# Patient Record
Sex: Male | Born: 1940
Health system: Southern US, Community
[De-identification: ages and names within clinical notes are randomized; demographics above are authoritative.]

## PROBLEM LIST (undated history)

## (undated) DIAGNOSIS — C801 Malignant (primary) neoplasm, unspecified: Secondary | ICD-10-CM

## (undated) DIAGNOSIS — I10 Essential (primary) hypertension: Secondary | ICD-10-CM

## (undated) DIAGNOSIS — R3912 Poor urinary stream: Secondary | ICD-10-CM

## (undated) DIAGNOSIS — N400 Enlarged prostate without lower urinary tract symptoms: Secondary | ICD-10-CM

## (undated) DIAGNOSIS — R972 Elevated prostate specific antigen [PSA]: Secondary | ICD-10-CM

## (undated) DIAGNOSIS — N529 Male erectile dysfunction, unspecified: Secondary | ICD-10-CM

## (undated) HISTORY — PX: PROSTATE BIOPSY: SHX241

---

## 2000-03-16 ENCOUNTER — Encounter: Payer: Self-pay | Admitting: Internal Medicine

## 2000-03-16 ENCOUNTER — Encounter: Admission: RE | Admit: 2000-03-16 | Discharge: 2000-03-16 | Payer: Self-pay | Admitting: Internal Medicine

## 2000-03-23 ENCOUNTER — Encounter: Admission: RE | Admit: 2000-03-23 | Discharge: 2000-03-23 | Payer: Self-pay | Admitting: Internal Medicine

## 2000-03-23 ENCOUNTER — Encounter: Payer: Self-pay | Admitting: Internal Medicine

## 2011-07-13 ENCOUNTER — Encounter (HOSPITAL_COMMUNITY): Payer: Self-pay

## 2011-07-13 ENCOUNTER — Emergency Department (HOSPITAL_COMMUNITY)
Admission: EM | Admit: 2011-07-13 | Discharge: 2011-07-13 | Disposition: A | Discharge status: Home / Self Care | Payer: Medicare HMO | Attending: Emergency Medicine | Admitting: Emergency Medicine

## 2011-07-13 DIAGNOSIS — I10 Essential (primary) hypertension: Secondary | ICD-10-CM | POA: Insufficient documentation

## 2011-07-13 DIAGNOSIS — S335XXA Sprain of ligaments of lumbar spine, initial encounter: Secondary | ICD-10-CM | POA: Insufficient documentation

## 2011-07-13 DIAGNOSIS — Y9302 Activity, running: Secondary | ICD-10-CM | POA: Insufficient documentation

## 2011-07-13 DIAGNOSIS — S39012A Strain of muscle, fascia and tendon of lower back, initial encounter: Secondary | ICD-10-CM

## 2011-07-13 DIAGNOSIS — X58XXXA Exposure to other specified factors, initial encounter: Secondary | ICD-10-CM | POA: Insufficient documentation

## 2011-07-13 HISTORY — DX: Essential (primary) hypertension: I10

## 2011-07-13 LAB — URINALYSIS, ROUTINE W REFLEX MICROSCOPIC
Bilirubin Urine: NEGATIVE
Glucose, UA: NEGATIVE mg/dL
Hgb urine dipstick: NEGATIVE
Ketones, ur: NEGATIVE mg/dL
Nitrite: NEGATIVE
Protein, ur: NEGATIVE mg/dL
Specific Gravity, Urine: 1.016 (ref 1.005–1.030)
Urobilinogen, UA: 0.2 mg/dL (ref 0.0–1.0)
pH: 7.5 (ref 5.0–8.0)

## 2011-07-13 LAB — URINE MICROSCOPIC-ADD ON

## 2011-07-13 MED ORDER — NAPROXEN 500 MG PO TABS: 500.0000 mg | ORAL_TABLET | Freq: Two times a day (BID) | ORAL | Status: AC

## 2011-07-13 MED ORDER — ORPHENADRINE CITRATE ER 100 MG PO TB12: 100.0000 mg | ORAL_TABLET | Freq: Two times a day (BID) | ORAL | Status: AC

## 2011-07-13 MED ORDER — HYDROCODONE-ACETAMINOPHEN 5-325 MG PO TABS: 1.0000 | ORAL_TABLET | Freq: Four times a day (QID) | ORAL | Status: AC | PRN

## 2011-07-13 NOTE — ED Provider Notes (Signed)
Medical screening examination/treatment/procedure(s) were conducted as a shared visit with non-physician practitioner(s) and myself.  I personally evaluated the patient during the encounter  Patient with hamstring injury last week.  He now has some lower left back pain.  This is likely musculoskeletal in nature as it can worsen with changes in position.  He has no associated signs of infection.  No urinary symptoms.  No bowel symptoms.  No abdominal pain.  No neurovascular deficits at this time.  Patient will go home and followup with his primary care physician if not improving.  Nat Christen, MD 07/13/11 [phone]

## 2011-07-13 NOTE — Discharge Instructions (Signed)
Read the information below.  Do not take additional tylenol while taking the prescribed medication Norco (hydrocodone-acetaminophen) to avoid tylenol overdose.  If you develop uncontrolled pain, fevers, loss of control of bowel or bladder, weakness or numbness of your legs, return to the ER immediately.  You may return to the ER at any time for worsening condition or any new symptoms that concern you.

## 2011-07-13 NOTE — ED Provider Notes (Signed)
History     CSN: [id number]  Arrival date & time 07/13/11  0750   First MD Initiated Contact with Patient 07/13/11 0801      Chief Complaint  Patient presents with  . Back Pain    (Consider location/radiation/quality/duration/timing/severity/associated sxs/prior treatment) HPI Comments: Patient is a healthy runner, recently suffered a hamstring injury and has been resting and not running until 4 days ago when he began running again and ran a long distance.  That night, he began having pain in his left lower back.  Pain is described as a soreness, is worse with moving, bending, or attempting to stand from a seated position, is improved with laying in a recliner with a heating pad.  If pain radiates, he states it radiates laterally across low back.  Denies fevers, abdominal pain, urinary symptoms, loss of control of bowel or bladder.    Patient is a 71 y.o. male presenting with back pain. The history is provided by the patient.  Back Pain  Pertinent negatives include no chest pain, no fever, no numbness, no dysuria and no weakness.    Past Medical History  Diagnosis Date  . Hypertension     History reviewed. No pertinent past surgical history.  No family history on file.  History  Substance Use Topics  . Smoking status: Not on file  . Smokeless tobacco: Not on file  . Alcohol Use: No      Review of Systems  Constitutional: Negative for fever and chills.  Respiratory: Negative for shortness of breath.   Cardiovascular: Negative for chest pain.  Gastrointestinal: Negative for nausea and vomiting.  Genitourinary: Negative for dysuria, urgency and frequency.  Musculoskeletal: Positive for back pain.  Neurological: Negative for weakness and numbness.    Allergies  Review of patient's allergies indicates no known allergies.  Home Medications  No current outpatient prescriptions on file.  BP 147/84  Pulse 79  Temp(Src) 98.5 F (36.9 C) (Oral)  Resp 20  SpO2  98%  Physical Exam  Nursing note and vitals reviewed. Constitutional: He is oriented to person, place, and time. He appears well-developed and well-nourished.  HENT:  Head: Normocephalic and atraumatic.  Neck: Normal range of motion. Neck supple.  Cardiovascular: Normal rate and regular rhythm.   Pulmonary/Chest: Effort normal and breath sounds normal. No respiratory distress. He has no wheezes. He has no rales.  Abdominal: Soft. He exhibits no distension and no mass. There is no tenderness. There is no rebound and no guarding.  Musculoskeletal: Normal range of motion. He exhibits no edema and no tenderness.       Cervical back: Normal.       Thoracic back: Normal.       Lumbar back: Normal.       Lower extremities: strength 5/5, sensation intact, distal pulses intact.  Negative straight leg raise.    Neurological: He is alert and oriented to person, place, and time. He has normal strength. No sensory deficit.    ED Course  Procedures (including critical care time)  Labs Reviewed  URINALYSIS, ROUTINE W REFLEX MICROSCOPIC - Abnormal; Notable for the following:    Leukocytes, UA TRACE (*)    All other components within normal limits  URINE MICROSCOPIC-ADD ON   No results found.  8:31 AM Discussed patient with Dr Golda Acre.    Patient also seen by Dr Golda Acre.    1. Lumbosacral strain, initial encounter       MDM  Healthy athletic patient with recent injury to  left hamstring (2 weeks ago), began running again a few days ago and developed soreness in his left lower back, worse with movement, etc.  Appears very clearly to be muscular. No bony or midline tenderness.  No neurological symptoms.  Pt d/c home with NSAIDs, norflex (changed to robaxin - contact by flow manager - pharmacist did not have norflex in stock), and norco.  Return precautions given.  PCP follow up.  Patient verbalizes understanding and agrees with plan.            Rise Patience, [place] 07/13/11 1600

## 2011-07-13 NOTE — ED Notes (Signed)
Pt c/o lower back pain radiating to lt buttocks, denies injury

## 2011-07-13 NOTE — ED Notes (Signed)
[place] @ [facility] pharmacy called requesting change in Norflex rx, pharmacy does not carry med anymore. T.O. Robaxin 750 mg tabs, two tabs PO up to four times a day prn muscles spasms/pain, dispense forty given by Trixie Dredge PA to [facility] RN, read back and verified. Robaxin RX called to [place] @ [facility] [phone].

## 2011-07-15 NOTE — ED Provider Notes (Signed)
Medical screening examination/treatment/procedure(s) were performed by non-physician practitioner and as supervising physician I was immediately available for consultation/collaboration.   Dimas Scheck A Hilari Wethington, MD 07/15/11 0006 

## 2011-07-19 ENCOUNTER — Encounter: Payer: Self-pay | Admitting: Family Medicine

## 2011-07-19 ENCOUNTER — Ambulatory Visit (INDEPENDENT_AMBULATORY_CARE_PROVIDER_SITE_OTHER): Payer: Medicare HMO | Admitting: Family Medicine

## 2011-07-19 VITALS — BP 122/79 | HR 89 | Ht 70.0 in | Wt 175.0 lb

## 2011-07-19 DIAGNOSIS — M545 Low back pain, unspecified: Secondary | ICD-10-CM

## 2011-07-19 NOTE — Assessment & Plan Note (Addendum)
May have partial tear (?erector spinae vs transversus abdominus) while pulling rope to try to get leaf blower started. Pain is improving. Patient given hip and core strengthening exercises and a return-to-running regimen. May continue NSAIDs prn.

## 2011-07-19 NOTE — Patient Instructions (Addendum)
Try the elliptical/bike for the next 1-2 weeks until running does not hurt. Then go on some slow runs.   Try the exercises to help rehabilitate your hip.  Incorporate core strengthening exercises to your work-out regimen.   Return to clinic as needed.   2. hip abductor rotations. standing, hip flexion and rotation outward then inward. 3 sets, 15 reps. when can do comfortably, add ankle weights starting at 2 pounds.   4. SINK STRETCH - YOU CAN DO THIS WHENEVER YOU WANT DURING THE DAY  Core Workouts given in the office   Recheck in 4 weeks

## 2011-07-19 NOTE — Progress Notes (Signed)
  Subjective:    Patient ID: Harry Garcia, male    DOB: Mar 05, 1940, 71 y.o.   MRN: [id number]  HPI 71 year old healthy, active male who is a serious runner (30-40 miles a week) who has had pain in his left hip/back for about 2 weeks. Inciting event was probably pulling a rope to get leaf blower started. He started having pain the next day.  Last Wednesday, he went to the ED because of a muscle spasm and was given an NSAID and muscle relaxants to take. He has not been running since the pain started.  Pain today is much better than Wednesday, and he has not been needing medications every day.   Review of Systems Per HPI.  Past Medical History, Family History, and Social History reviewed.     Objective:   Physical Exam GEN: NAD; Caucasian; well-appearing, well-nourished; appears fit PSYCH: engaged, appropriate to questions, alert and oriented x 3 PULM: NI WOB  GEN: WDWN, NAD, Non-toxic, Alert & Oriented x 3 HEENT: Atraumatic, Normocephalic.  Ears and Nose: No external deformity. EXTR: No clubbing/cyanosis/edema NEURO: Normal gait.  PSYCH: Normally interactive. Conversant. Not depressed or anxious appearing.  Calm demeanor.   TTP L lower erector spinae and lateral  HIP EXAM: SIDE: LEFT ROM: Abduction, Flexion, Internal and External range of motion: intact Pain with terminal IROM and EROM: none GTB: SLR: NEG Knees: No effusion FABER: NT REVERSE FABER: NT, neg Piriformis: ?minimal tenderness  Str: flexion: 5/5 abduction: 5/5 adduction: 5/5    Assessment & Plan:

## 2011-09-19 ENCOUNTER — Encounter: Payer: Self-pay | Admitting: Sports Medicine

## 2011-09-19 ENCOUNTER — Ambulatory Visit (INDEPENDENT_AMBULATORY_CARE_PROVIDER_SITE_OTHER): Payer: Medicare Other | Admitting: Sports Medicine

## 2011-09-19 VITALS — BP 124/70

## 2011-09-19 DIAGNOSIS — S76309A Unspecified injury of muscle, fascia and tendon of the posterior muscle group at thigh level, unspecified thigh, initial encounter: Secondary | ICD-10-CM

## 2011-09-19 DIAGNOSIS — S79919A Unspecified injury of unspecified hip, initial encounter: Secondary | ICD-10-CM

## 2011-09-19 NOTE — Progress Notes (Signed)
  Subjective:    Patient ID: Harry Garcia, male    DOB: 1940/03/31, 71 y.o.   MRN: [id number]  HPI chief complaint: Left hamstring pain  Patient comes in today with persistent pain in the left hamstring. He most recently saw Dr. Dallas Schimke who placed him on a comprehensive core strengthening program. Dr. Dallas Schimke felt like some of his symptoms may be originating from his low-back. Patient's symptoms were improving until he went for a 5 mile run. Since then he's had a return of pain. Most of his pain is in the back of the left knee but some pain more proximally in the hamstring muscle belly. He describes his pain as a "tightness ". He also gets intermittent left-sided low back pain. All of his symptoms are worse after activity. No real pain while running. Mild pain with sitting. No associated numbness or tingling. No deep-seated groin. His symptoms all began during a race this past spring. At that time he felt an acute pull in the back of his left knee.    Review of Systems     Objective:   Physical Exam Well-developed, well-nourished. No acute distress Lumbar spine shows full painless lumbar mobility. No tenderness to palpation along the lumbar midline or paraspinal musculature. No spasm. Left hip shows smooth painless hip range of motion with a negative log roll Neurological exam shows strength to be 5/5 both lower extremities. Reflexes 1/4 at the patellar and Achilles tendons bilaterally. No atrophy. Sensation is intact to light-touch distally. Examination of the left knee shows full motion without an effusion. He is tender to palpation along the biceps femoris tendon just proximal to the popliteal fossa. No palpable defect. Some tenderness to palpation just medial to the tendon as well. Good strength. No ecchymosis. He walks without a limp.  MSK ultrasound of the left knee: Quick scan of the popliteal shows some hypoechogenicity along the course of the biceps femoris tendon consistent with  probable tendinitis. This is in comparison to the right biceps femoris tendon which appears normal. I do not however appreciate any tears.       Assessment & Plan:  1. Distal hamstring tendinitis   Although there is a possibility that his symptoms are referred from his lumbar spine, his physical exam and ultrasound suggest tendinitis of the distal hamstring tendon. We will treat him with some eccentric strengthening and a body helix sleeve. He can continue with activity including running as tolerated. Followup with me in 4 weeks. If symptoms persist or worsen, I may consider starting with a lumbar spine x-ray. He will call with questions or concerns in the interim.

## 2011-10-06 ENCOUNTER — Encounter: Payer: Self-pay | Admitting: Sports Medicine

## 2011-10-06 ENCOUNTER — Ambulatory Visit (INDEPENDENT_AMBULATORY_CARE_PROVIDER_SITE_OTHER): Payer: Medicare Other | Admitting: Sports Medicine

## 2011-10-06 VITALS — BP 136/80 | HR 73 | Ht 70.0 in | Wt 175.0 lb

## 2011-10-06 DIAGNOSIS — IMO0002 Reserved for concepts with insufficient information to code with codable children: Secondary | ICD-10-CM

## 2011-10-06 DIAGNOSIS — S76319A Strain of muscle, fascia and tendon of the posterior muscle group at thigh level, unspecified thigh, initial encounter: Secondary | ICD-10-CM

## 2011-10-06 NOTE — Progress Notes (Signed)
  Subjective:    Patient ID: Harry Garcia, male    DOB: 1940-08-11, 71 y.o.   MRN: [id number]  HPI patient comes in today for followup on his left hamstring injury. Feeling much better. Body heel is compression sleeve has been helpful. He centric exercises were somewhat painful so he has stopped those but despite that his leg pain has improved. He still having a little bit of left-sided low back pain but not his pronounced as on his previous visit. He states he is somewhat stiff with first getting up in the morning but this resolved with activity. He has been able to continue running and in fact states that running is fairly pain-free.    Review of Systems     Objective:   Physical Exam Good lumbar range of motion. No tenderness to palpation over the lumbar midline. No appreciable muscle spasm. Left knee shows full motion without effusion. No tenderness over the distal biceps tendons. Good joint stability Neurological exam shows a negative straight leg raise bilaterally with strength 5/5 both lower charities. No muscle atrophy. He walks without a limp.       Assessment & Plan:  1. Improved left knee pain secondary to hamstring strain   Patient is feeling much better. Only real complaint is a persistent low back stiffness first thing in the morning but not severe enough that he feels like he needs to be worked up further. He can continue with activity as tolerated. I'm still not 100% convinced that he doesn't have some element of discomfort in the left knee from his low-back but with his improvement I think we can hold on further workup and treatment. He will let me know if symptoms persist or worsen.

## 2011-11-14 ENCOUNTER — Telehealth: Payer: Self-pay | Admitting: Sports Medicine

## 2011-11-14 NOTE — Telephone Encounter (Signed)
I spoke with the patient today on the phone. He's having some returning discomfort along the left side of his lumbar spine and hip. His symptoms were exacerbated after a long car ride recently. He is having some stiffness with first awakening along with pain with activity such as running. We discussed the possibility of his lumbar spine being the culprit for his symptoms during his last visit. It is quite possible that he has some facet arthropathy which is causing him some discomfort. We discussed the possibility of x-rays to investigate this further, but the patient would instead like to try to modify his workouts for the next 3-4 weeks to see if symptoms will resolve on their own. He noted that an occasional ibuprofen or Advil does alleviate his pain. If his symptoms persist or worsen he will return to the office for reexamination and consideration of x-rays of his lumbar spine.

## 2011-11-14 NOTE — Telephone Encounter (Signed)
Message copied by Ralene Cork on Mon Nov 14, 2011  3:42 PM ------      Message from: CERESI, Shawna Orleans L      Created: Mon Nov 14, 2011  9:26 AM      Regarding: phone message      Contact: [phone]       Pt would like to talk to you regarding his running injury, still having pain.  Please call him at the number above.

## 2011-11-30 ENCOUNTER — Ambulatory Visit (INDEPENDENT_AMBULATORY_CARE_PROVIDER_SITE_OTHER): Payer: Medicare HMO | Admitting: Sports Medicine

## 2011-11-30 ENCOUNTER — Ambulatory Visit
Admission: RE | Admit: 2011-11-30 | Discharge: 2011-11-30 | Disposition: A | Discharge status: Home / Self Care | Payer: Medicare HMO | Source: Ambulatory Visit | Attending: Sports Medicine | Admitting: Sports Medicine

## 2011-11-30 ENCOUNTER — Telehealth: Payer: Self-pay | Admitting: Sports Medicine

## 2011-11-30 VITALS — BP 134/82 | Ht 70.0 in | Wt 180.0 lb

## 2011-11-30 DIAGNOSIS — M545 Low back pain, unspecified: Secondary | ICD-10-CM

## 2011-11-30 DIAGNOSIS — M5137 Other intervertebral disc degeneration, lumbosacral region: Secondary | ICD-10-CM

## 2011-11-30 DIAGNOSIS — M5136 Other intervertebral disc degeneration, lumbar region: Secondary | ICD-10-CM

## 2011-11-30 NOTE — Telephone Encounter (Signed)
I spoke with the patient today on the phone regarding x-rays of his lumbar spine. X-rays confirm will we suspected clinically and that is that he has degenerative disc disease. It is most pronounced at L5-S1. He will proceed with treatment as outlined in the progress note earlier today and will followup with me when necessary.

## 2011-11-30 NOTE — Progress Notes (Signed)
  Subjective:    Patient ID: Harry Garcia, male    DOB: December 17, 1940, 71 y.o.   MRN: [id number]  HPI  Patient comes in today with persistent low back pain. The pain he was having in his left hamstring previously has resolved. His main complaint is stiffness in his low back first thing in the morning as well as with first getting up from a seated position. Stiffness can be present for 30 minutes to 2 hours. Minimal pain. Stiffness improves with activity. He enjoys running and states that he is able to arrive in practically pain free. He believes that his stiffness is likely due to arthritis of his back in the main reason for today's visit is for me to either confirm or deny this.    Review of Systems     Objective:   Physical Exam Well-developed, well-nourished. No acute distress. Awake alert and oriented x3  Lumbar spine shows full painless lumbar range of motion. No tenderness along the lumbar midline. Slight tenderness just to the left of the L5 vertebrae but no paraspinal muscle spasm. No tenderness over the SI joints. Negative straight leg raise bilaterally, negative log roll bilaterally. Strength is 5/5 both lower extremities reflexes are 1/4 at the Achilles and patellar tendons bilaterally. No muscle atrophy. Sensation is intact to light-touch distally. He walks without a limp.       Assessment & Plan:  1. Low back pain and stiffness secondary to lumbar degenerative disc disease  Patient symptoms are pretty classic for lumbar degenerative disc disease. I will get an x-ray to confirm this diagnosis. I'll have him do a little formal physical therapy but I think you'll quickly wean to a home exercise program. I reassured him that he can continue to be as active as his symptoms allow. I'll call him after I reviewed his x-rays I will plan on seeing him back in the office on an as-needed basis.

## 2012-10-10 ENCOUNTER — Encounter: Payer: Self-pay | Admitting: Sports Medicine

## 2012-10-10 ENCOUNTER — Ambulatory Visit (INDEPENDENT_AMBULATORY_CARE_PROVIDER_SITE_OTHER): Payer: Medicare HMO | Admitting: Sports Medicine

## 2012-10-10 VITALS — BP 137/84 | HR 73 | Ht 70.0 in | Wt 178.0 lb

## 2012-10-10 DIAGNOSIS — M7662 Achilles tendinitis, left leg: Secondary | ICD-10-CM

## 2012-10-10 DIAGNOSIS — M7660 Achilles tendinitis, unspecified leg: Secondary | ICD-10-CM

## 2012-10-10 MED ORDER — NITROGLYCERIN 0.2 MG/HR TD PT24: MEDICATED_PATCH | TRANSDERMAL | Status: DC

## 2012-10-10 NOTE — Patient Instructions (Addendum)
Thank you for coming in today Okay to continue running as long as you have less than 3/10 pain and no limp. Consider 1 week of crosstraining before running Do calf exercises every day Try insoles with heel lift. Use nitroglycerin patch as below  Nitroglycerin Protocol   Apply 1/4 nitroglycerin patch to affected area daily.  Change position of patch within the affected area every 24 hours.  You may experience a headache during the first 1-2 weeks of using the patch, these should subside.  If you experience headaches after beginning nitroglycerin patch treatment, you may take your preferred over the counter pain reliever.  Another side effect of the nitroglycerin patch is skin irritation or rash related to patch adhesive.  Please notify our office if you develop more severe headaches or rash, and stop the patch.  Tendon healing with nitroglycerin patch may require 12 to 24 weeks depending on the extent of injury.  Men should not use if taking Viagra, Cialis, or Levitra.   Do not use if you have migraines or rosacea.

## 2012-10-10 NOTE — Progress Notes (Signed)
CC: Left Achilles tendinitis HPI: Patient is a very pleasant 72 year old male runner appearing younger than stated age who presents today for evaluation of left Achilles tendinitis. Patient states that he has continued his normal running routine of 60 minutes per day on a treadmill at approximately a 7 minute mile pace. He has not recently changed his mileage or pace. He states that about 3 weeks ago he was about 40 minutes into his run and started to notice pain in his left Achilles. He was able to continue running but later that day after he was finished he had worsening pain. He noted that it hurt him a lot to go down stairs. He took 7-10 days off of running and does a lot of crosstraining. However, he started running again last week and has noticed increased pain again. He is concerned because he wants to ensure that he does not have a more serious problem like a ruptured Achilles or stress fracture. He notes that his Achilles is tender to palpation. No swelling. He has not tried any medications, ice, heat, stretching.  ROS: As above in the HPI. All other systems are stable or negative.  OBJECTIVE: APPEARANCE:  Patient in no acute distress.The patient appeared well nourished and normally developed. HEENT: No scleral icterus. Conjunctiva non-injected Resp: Non labored Skin: No rash MSK:  Ankle: - No visible erythema or swelling. - Range of motion is full in all directions. - Tender over the Achilles tendon midsubstance with palpable thickening-  - Strength is 5/5 in all directions. - Stable lateral and medial ligaments; squeeze test and kleiger test unremarkable; - Talar dome nontender;  - Pain at base of 5th MT; No tenderness over cuboid; - No tenderness over N spot or navicular prominence - No tenderness on posterior aspects of lateral and medial malleolus - No sign of peroneal tendon subluxations; - Able to walk 4 steps.   MSK [place]: Musculoskeletal ultrasound of the left Achilles  tendon was performed in transverse and longitudinal views. There is noted to be thickening of the tendon in the midsubstance to a maximum of 0.9 cm. There is a hypoechoic layer superficial to the tendon consistent with edema. There is no increased Doppler signal to suggest neovascularization. No sign of fiber disruption or tear.   ASSESSMENT: #1. Left Achilles tendinitis, early stage   PLAN: Discussed with the patient that we think that he is relatively early in this problem, substrata correctly we may be able to get him better fairly quickly. Recommended that he consider crosstraining for one additional week with gradual return to running. He may run as long as he is not limping and has pain less than a 3/10. Recommended that he consider decreasing his mileage and paced. He was given a home exercise plan with decentered heel raises. We also gave him sport insoles with a heel lift to use in his exercise shoes. Finally, he will try the nitroglycerin protocol. We will see him back in 3-4 weeks for reevaluation and repeat ultrasound.

## 2012-11-21 ENCOUNTER — Ambulatory Visit: Payer: Medicare HMO | Admitting: Sports Medicine

## 2012-12-27 ENCOUNTER — Other Ambulatory Visit: Payer: Self-pay

## 2014-08-18 ENCOUNTER — Other Ambulatory Visit: Payer: Self-pay

## 2015-03-25 DIAGNOSIS — R69 Illness, unspecified: Secondary | ICD-10-CM | POA: Diagnosis not present

## 2015-08-05 DIAGNOSIS — H524 Presbyopia: Secondary | ICD-10-CM | POA: Diagnosis not present

## 2015-11-23 DIAGNOSIS — Z1389 Encounter for screening for other disorder: Secondary | ICD-10-CM | POA: Diagnosis not present

## 2015-11-23 DIAGNOSIS — N528 Other male erectile dysfunction: Secondary | ICD-10-CM | POA: Diagnosis not present

## 2015-11-23 DIAGNOSIS — E7800 Pure hypercholesterolemia, unspecified: Secondary | ICD-10-CM | POA: Diagnosis not present

## 2015-11-23 DIAGNOSIS — I10 Essential (primary) hypertension: Secondary | ICD-10-CM | POA: Diagnosis not present

## 2015-11-23 DIAGNOSIS — Z79899 Other long term (current) drug therapy: Secondary | ICD-10-CM | POA: Diagnosis not present

## 2015-11-23 DIAGNOSIS — Z23 Encounter for immunization: Secondary | ICD-10-CM | POA: Diagnosis not present

## 2015-11-23 DIAGNOSIS — Z0001 Encounter for general adult medical examination with abnormal findings: Secondary | ICD-10-CM | POA: Diagnosis not present

## 2015-11-23 DIAGNOSIS — J309 Allergic rhinitis, unspecified: Secondary | ICD-10-CM | POA: Diagnosis not present

## 2015-11-23 DIAGNOSIS — E559 Vitamin D deficiency, unspecified: Secondary | ICD-10-CM | POA: Diagnosis not present

## 2015-11-23 DIAGNOSIS — R972 Elevated prostate specific antigen [PSA]: Secondary | ICD-10-CM | POA: Diagnosis not present

## 2016-03-23 DIAGNOSIS — R69 Illness, unspecified: Secondary | ICD-10-CM | POA: Diagnosis not present

## 2016-12-12 DIAGNOSIS — R69 Illness, unspecified: Secondary | ICD-10-CM | POA: Diagnosis not present

## 2017-01-25 DIAGNOSIS — J309 Allergic rhinitis, unspecified: Secondary | ICD-10-CM | POA: Diagnosis not present

## 2017-01-25 DIAGNOSIS — I10 Essential (primary) hypertension: Secondary | ICD-10-CM | POA: Diagnosis not present

## 2017-01-25 DIAGNOSIS — E7800 Pure hypercholesterolemia, unspecified: Secondary | ICD-10-CM | POA: Diagnosis not present

## 2017-01-25 DIAGNOSIS — Z79899 Other long term (current) drug therapy: Secondary | ICD-10-CM | POA: Diagnosis not present

## 2017-01-25 DIAGNOSIS — E559 Vitamin D deficiency, unspecified: Secondary | ICD-10-CM | POA: Diagnosis not present

## 2017-01-25 DIAGNOSIS — R972 Elevated prostate specific antigen [PSA]: Secondary | ICD-10-CM | POA: Diagnosis not present

## 2017-01-25 DIAGNOSIS — Z0000 Encounter for general adult medical examination without abnormal findings: Secondary | ICD-10-CM | POA: Diagnosis not present

## 2017-01-25 DIAGNOSIS — N528 Other male erectile dysfunction: Secondary | ICD-10-CM | POA: Diagnosis not present

## 2017-01-25 DIAGNOSIS — Z1389 Encounter for screening for other disorder: Secondary | ICD-10-CM | POA: Diagnosis not present

## 2017-02-22 DIAGNOSIS — R69 Illness, unspecified: Secondary | ICD-10-CM | POA: Diagnosis not present

## 2017-06-07 DIAGNOSIS — H524 Presbyopia: Secondary | ICD-10-CM | POA: Diagnosis not present

## 2017-11-06 DIAGNOSIS — R69 Illness, unspecified: Secondary | ICD-10-CM | POA: Diagnosis not present

## 2018-03-13 DIAGNOSIS — R69 Illness, unspecified: Secondary | ICD-10-CM | POA: Diagnosis not present

## 2018-04-25 DIAGNOSIS — J309 Allergic rhinitis, unspecified: Secondary | ICD-10-CM | POA: Diagnosis not present

## 2018-04-25 DIAGNOSIS — E7800 Pure hypercholesterolemia, unspecified: Secondary | ICD-10-CM | POA: Diagnosis not present

## 2018-04-25 DIAGNOSIS — Z1389 Encounter for screening for other disorder: Secondary | ICD-10-CM | POA: Diagnosis not present

## 2018-04-25 DIAGNOSIS — R972 Elevated prostate specific antigen [PSA]: Secondary | ICD-10-CM | POA: Diagnosis not present

## 2018-04-25 DIAGNOSIS — Z79899 Other long term (current) drug therapy: Secondary | ICD-10-CM | POA: Diagnosis not present

## 2018-04-25 DIAGNOSIS — E559 Vitamin D deficiency, unspecified: Secondary | ICD-10-CM | POA: Diagnosis not present

## 2018-04-25 DIAGNOSIS — I10 Essential (primary) hypertension: Secondary | ICD-10-CM | POA: Diagnosis not present

## 2018-04-25 DIAGNOSIS — Z0001 Encounter for general adult medical examination with abnormal findings: Secondary | ICD-10-CM | POA: Diagnosis not present

## 2018-04-25 DIAGNOSIS — N528 Other male erectile dysfunction: Secondary | ICD-10-CM | POA: Diagnosis not present

## 2018-04-27 DIAGNOSIS — Z8379 Family history of other diseases of the digestive system: Secondary | ICD-10-CM | POA: Diagnosis not present

## 2018-04-27 DIAGNOSIS — R972 Elevated prostate specific antigen [PSA]: Secondary | ICD-10-CM | POA: Diagnosis not present

## 2018-06-25 DIAGNOSIS — R972 Elevated prostate specific antigen [PSA]: Secondary | ICD-10-CM | POA: Diagnosis not present

## 2018-10-01 DIAGNOSIS — N402 Nodular prostate without lower urinary tract symptoms: Secondary | ICD-10-CM | POA: Diagnosis not present

## 2018-10-01 DIAGNOSIS — R972 Elevated prostate specific antigen [PSA]: Secondary | ICD-10-CM | POA: Diagnosis not present

## 2018-10-22 DIAGNOSIS — R972 Elevated prostate specific antigen [PSA]: Secondary | ICD-10-CM | POA: Diagnosis not present

## 2018-10-22 DIAGNOSIS — N410 Acute prostatitis: Secondary | ICD-10-CM | POA: Diagnosis not present

## 2018-11-12 DIAGNOSIS — R69 Illness, unspecified: Secondary | ICD-10-CM | POA: Diagnosis not present

## 2019-02-11 DIAGNOSIS — R972 Elevated prostate specific antigen [PSA]: Secondary | ICD-10-CM | POA: Diagnosis not present

## 2019-02-11 DIAGNOSIS — N4232 Atypical small acinar proliferation of prostate: Secondary | ICD-10-CM | POA: Diagnosis not present

## 2019-02-11 DIAGNOSIS — N402 Nodular prostate without lower urinary tract symptoms: Secondary | ICD-10-CM | POA: Diagnosis not present

## 2019-03-07 ENCOUNTER — Ambulatory Visit: Payer: Medicare Other | Attending: Internal Medicine

## 2019-03-07 DIAGNOSIS — Z23 Encounter for immunization: Secondary | ICD-10-CM | POA: Insufficient documentation

## 2019-03-07 NOTE — Progress Notes (Signed)
   Covid-19 Vaccination Clinic  Name:  Harry Garcia    MRN: [id number] DOB: Aug 23, 1940  03/07/2019  Mr. Harry Garcia was observed post Covid-19 immunization for 15 minutes without incidence. He was provided with Vaccine Information Sheet and instruction to access the V-Safe system.   Mr. Harry Garcia was instructed to call 911 with any severe reactions post vaccine: Marland Kitchen Difficulty breathing  . Swelling of your face and throat  . A fast heartbeat  . A bad rash all over your body  . Dizziness and weakness    Immunizations Administered    Name Date Dose VIS Date Route   Pfizer COVID-19 Vaccine 03/07/2019  9:37 AM 0.3 mL 02/01/2019 Intramuscular   Manufacturer: [employer], [employer]   Lot: [id number]   NDC: [address]-5

## 2019-03-28 ENCOUNTER — Ambulatory Visit: Payer: Medicare HMO | Attending: Internal Medicine

## 2019-03-28 DIAGNOSIS — Z23 Encounter for immunization: Secondary | ICD-10-CM

## 2019-03-28 NOTE — Progress Notes (Signed)
   Covid-19 Vaccination Clinic  Name:  Harry Garcia    MRN: [id number] DOB: 09/22/1940  03/28/2019  Harry Garcia was observed post Covid-19 immunization for 15 minutes without incidence. He was provided with Vaccine Information Sheet and instruction to access the V-Safe system.   Harry Garcia was instructed to call 911 with any severe reactions post vaccine: Marland Kitchen Difficulty breathing  . Swelling of your face and throat  . A fast heartbeat  . A bad rash all over your body  . Dizziness and weakness    Immunizations Administered    Name Date Dose VIS Date Route   Pfizer COVID-19 Vaccine 03/28/2019  8:14 AM 0.3 mL 02/01/2019 Intramuscular   Manufacturer: [employer], [employer]   Lot: [id number]   NDC: [address]-0

## 2019-05-15 DIAGNOSIS — H524 Presbyopia: Secondary | ICD-10-CM | POA: Diagnosis not present

## 2019-06-03 DIAGNOSIS — R69 Illness, unspecified: Secondary | ICD-10-CM | POA: Diagnosis not present

## 2019-06-17 DIAGNOSIS — N402 Nodular prostate without lower urinary tract symptoms: Secondary | ICD-10-CM | POA: Diagnosis not present

## 2019-06-17 DIAGNOSIS — R972 Elevated prostate specific antigen [PSA]: Secondary | ICD-10-CM | POA: Diagnosis not present

## 2019-06-17 DIAGNOSIS — N4232 Atypical small acinar proliferation of prostate: Secondary | ICD-10-CM | POA: Diagnosis not present

## 2019-07-15 DIAGNOSIS — N4232 Atypical small acinar proliferation of prostate: Secondary | ICD-10-CM | POA: Diagnosis not present

## 2019-07-15 DIAGNOSIS — N400 Enlarged prostate without lower urinary tract symptoms: Secondary | ICD-10-CM | POA: Diagnosis not present

## 2019-07-15 DIAGNOSIS — R972 Elevated prostate specific antigen [PSA]: Secondary | ICD-10-CM | POA: Diagnosis not present

## 2019-07-15 DIAGNOSIS — N402 Nodular prostate without lower urinary tract symptoms: Secondary | ICD-10-CM | POA: Diagnosis not present

## 2019-07-24 DIAGNOSIS — Z8379 Family history of other diseases of the digestive system: Secondary | ICD-10-CM | POA: Diagnosis not present

## 2019-07-24 DIAGNOSIS — Z1211 Encounter for screening for malignant neoplasm of colon: Secondary | ICD-10-CM | POA: Diagnosis not present

## 2019-07-24 DIAGNOSIS — E559 Vitamin D deficiency, unspecified: Secondary | ICD-10-CM | POA: Diagnosis not present

## 2019-07-24 DIAGNOSIS — I10 Essential (primary) hypertension: Secondary | ICD-10-CM | POA: Diagnosis not present

## 2019-07-24 DIAGNOSIS — E7800 Pure hypercholesterolemia, unspecified: Secondary | ICD-10-CM | POA: Diagnosis not present

## 2019-07-24 DIAGNOSIS — R972 Elevated prostate specific antigen [PSA]: Secondary | ICD-10-CM | POA: Diagnosis not present

## 2019-07-24 DIAGNOSIS — Z79899 Other long term (current) drug therapy: Secondary | ICD-10-CM | POA: Diagnosis not present

## 2019-07-24 DIAGNOSIS — N528 Other male erectile dysfunction: Secondary | ICD-10-CM | POA: Diagnosis not present

## 2019-07-24 DIAGNOSIS — J309 Allergic rhinitis, unspecified: Secondary | ICD-10-CM | POA: Diagnosis not present

## 2019-07-24 DIAGNOSIS — Z1389 Encounter for screening for other disorder: Secondary | ICD-10-CM | POA: Diagnosis not present

## 2019-07-24 DIAGNOSIS — Z0000 Encounter for general adult medical examination without abnormal findings: Secondary | ICD-10-CM | POA: Diagnosis not present

## 2019-08-07 ENCOUNTER — Encounter (HOSPITAL_COMMUNITY): Payer: Self-pay

## 2019-08-07 ENCOUNTER — Emergency Department (HOSPITAL_COMMUNITY)
Admission: EM | Admit: 2019-08-07 | Discharge: 2019-08-07 | Disposition: A | Discharge status: Home / Self Care | Payer: Medicare HMO | Attending: Emergency Medicine | Admitting: Emergency Medicine

## 2019-08-07 ENCOUNTER — Other Ambulatory Visit: Payer: Self-pay

## 2019-08-07 DIAGNOSIS — R9721 Rising PSA following treatment for malignant neoplasm of prostate: Secondary | ICD-10-CM | POA: Diagnosis not present

## 2019-08-07 DIAGNOSIS — N411 Chronic prostatitis: Secondary | ICD-10-CM | POA: Diagnosis not present

## 2019-08-07 DIAGNOSIS — E785 Hyperlipidemia, unspecified: Secondary | ICD-10-CM | POA: Diagnosis not present

## 2019-08-07 DIAGNOSIS — N410 Acute prostatitis: Secondary | ICD-10-CM | POA: Diagnosis not present

## 2019-08-07 DIAGNOSIS — R339 Retention of urine, unspecified: Secondary | ICD-10-CM | POA: Diagnosis not present

## 2019-08-07 DIAGNOSIS — R972 Elevated prostate specific antigen [PSA]: Secondary | ICD-10-CM | POA: Diagnosis not present

## 2019-08-07 DIAGNOSIS — I10 Essential (primary) hypertension: Secondary | ICD-10-CM | POA: Diagnosis not present

## 2019-08-07 LAB — CBC WITH DIFFERENTIAL/PLATELET
Abs Immature Granulocytes: 0.04 10*3/uL (ref 0.00–0.07)
Basophils Absolute: 0.1 10*3/uL (ref 0.0–0.1)
Basophils Relative: 1 %
Eosinophils Absolute: 0 10*3/uL (ref 0.0–0.5)
Eosinophils Relative: 0 %
HCT: 46.5 % (ref 39.0–52.0)
Hemoglobin: 15.6 g/dL (ref 13.0–17.0)
Immature Granulocytes: 0 %
Lymphocytes Relative: 7 %
Lymphs Abs: 0.9 10*3/uL (ref 0.7–4.0)
MCH: 30.9 pg (ref 26.0–34.0)
MCHC: 33.5 g/dL (ref 30.0–36.0)
MCV: 92.1 fL (ref 80.0–100.0)
Monocytes Absolute: 0.8 10*3/uL (ref 0.1–1.0)
Monocytes Relative: 6 %
Neutro Abs: 11.5 10*3/uL — ABNORMAL HIGH (ref 1.7–7.7)
Neutrophils Relative %: 86 %
Platelets: 407 10*3/uL — ABNORMAL HIGH (ref 150–400)
RBC: 5.05 MIL/uL (ref 4.22–5.81)
RDW: 13.1 % (ref 11.5–15.5)
WBC: 13.4 10*3/uL — ABNORMAL HIGH (ref 4.0–10.5)
nRBC: 0 % (ref 0.0–0.2)

## 2019-08-07 LAB — URINALYSIS, COMPLETE (UACMP) WITH MICROSCOPIC
Bacteria, UA: NONE SEEN
Bilirubin Urine: NEGATIVE
Glucose, UA: NEGATIVE mg/dL
Ketones, ur: NEGATIVE mg/dL
Leukocytes,Ua: NEGATIVE
Nitrite: NEGATIVE
Protein, ur: NEGATIVE mg/dL
RBC / HPF: >50 RBC/hpf — ABNORMAL HIGH (ref 0–5)
Specific Gravity, Urine: 1.012 (ref 1.005–1.030)
pH: 6 (ref 5.0–8.0)

## 2019-08-07 LAB — BASIC METABOLIC PANEL
Anion gap: 8 (ref 5–15)
BUN: 16 mg/dL (ref 8–23)
CO2: 26 mmol/L (ref 22–32)
Calcium: 10.2 mg/dL (ref 8.9–10.3)
Chloride: 102 mmol/L (ref 98–111)
Creatinine, Ser: 0.77 mg/dL (ref 0.61–1.24)
GFR calc Af Amer: >60 mL/min (ref >60)
GFR calc non Af Amer: >60 mL/min (ref >60)
Glucose, Bld: 138 mg/dL — ABNORMAL HIGH (ref 70–99)
Potassium: 3.7 mmol/L (ref 3.5–5.1)
Sodium: 136 mmol/L (ref 135–145)

## 2019-08-07 NOTE — ED Triage Notes (Signed)
Patient reports he had prostate biopsy this morning and has no been able to fully urinate since and he has only been dribbling.   Patient sent over for PCP for possible foley.   A/Ox4 Ambulatory in triage

## 2019-08-07 NOTE — Discharge Instructions (Signed)
Please leave catheter in until you see urology next week   See your urologist   Return to ER if the catheter is not draining, clots in catheter, fever

## 2019-08-07 NOTE — ED Notes (Signed)
Bladder scan showed .

## 2019-08-07 NOTE — ED Provider Notes (Signed)
[facility]-EMERGENCY DEPT Provider Note   CSN: [id number] Arrival date & time: 08/07/19  1723     History Chief Complaint  Patient presents with  . Urinary Retention    Harry Garcia is a 79 y.o. male history of hypertension, who presented with urinary retention.  Patient had a prostate biopsy this morning and had a Foley placed.  He states that the Foley was taken out and then afterwards he was unable to urinate.  Patient states that he has severe suprapubic pain.  Denies any fevers or chills or vomiting.  He was noted to be in retention with bladder scan showed 400 cc in his bladder in triage.   The history is provided by the patient.       Past Medical History:  Diagnosis Date  . Hypertension     Patient Active Problem List   Diagnosis Date Noted  . Hamstring injury 09/19/2011  . Left low back pain 07/19/2011    History reviewed. No pertinent surgical history.     History reviewed. No pertinent family history.  Social History   Tobacco Use  . Smoking status: Never Smoker  . Smokeless tobacco: Never Used  Substance Use Topics  . Alcohol use: No  . Drug use: No    Home Medications Prior to Admission medications   Medication Sig Start Date End Date Taking? Authorizing Provider  aspirin (ASPIRIN EC) 81 MG EC tablet Take 81 mg by mouth at bedtime. Swallow whole.    Yes [provider]  cholecalciferol (VITAMIN D) 1000 UNITS tablet Take 2,000 Units by mouth daily.    Yes [provider]  lisinopril-hydrochlorothiazide (ZESTORETIC) 10-12.5 MG tablet Take 1 tablet by mouth daily.  05/30/19  Yes [provider]  Misc Natural Products (BETA-SITOSTEROL PLANT STEROLS) CAPS Take 1 capsule by mouth daily.   Yes [provider]  Multiple Vitamins-Minerals (MULTIVITAMIN WITH MINERALS) tablet Take 1 tablet by mouth daily.   Yes [provider]  simvastatin (ZOCOR) 5 MG tablet Take 5 mg by mouth daily.  06/15/19   Yes [provider]  tamsulosin (FLOMAX) 0.4 MG CAPS capsule Take 0.4 mg by mouth daily. 08/07/19  Yes [provider]  nitroGLYCERIN (NITRODUR - DOSED IN MG/24 HR) 0.2 mg/hr patch USE 1/4 PATCH TO THE AFFECTED AREA EVERY 24 HOURS Patient not taking: Reported on 08/07/2019 10/10/12   Ralene Cork, DO    Allergies    Patient has no known allergies.  Review of Systems   Review of Systems  Genitourinary: Positive for difficulty urinating.  All other systems reviewed and are negative.   Physical Exam Updated Vital Signs BP 135/72   Pulse 87   Temp 98 F (36.7 C) (Oral)   Resp 18   SpO2 95%   Physical Exam Vitals and nursing note reviewed.  HENT:     Head: Normocephalic.     Nose: Nose normal.     Mouth/Throat:     Mouth: Mucous membranes are moist.  Eyes:     Extraocular Movements: Extraocular movements intact.     Pupils: Pupils are equal, round, and reactive to light.  Cardiovascular:     Rate and Rhythm: Normal rate.     Pulses: Normal pulses.     Heart sounds: Normal heart sounds.  Pulmonary:     Effort: Pulmonary effort is normal.     Breath sounds: Normal breath sounds.  Abdominal:     General: Abdomen is flat.  Comments: + suprapubic tenderness, bladder is distended   Musculoskeletal:        General: Normal range of motion.     Cervical back: Normal range of motion.  Skin:    General: Skin is warm.     Capillary Refill: Capillary refill takes less than 2 seconds.  Neurological:     General: No focal deficit present.     Mental Status: He is alert and oriented to person, place, and time.  Psychiatric:        Mood and Affect: Mood normal.        Behavior: Behavior normal.     ED Results / Procedures / Treatments   Labs (all labs ordered are listed, but only abnormal results are displayed) Labs Reviewed  CBC WITH DIFFERENTIAL/PLATELET - Abnormal; Notable for the following components:      Result Value   WBC 13.4 (*)    Platelets  407 (*)    Neutro Abs 11.5 (*)    All other components within normal limits  BASIC METABOLIC PANEL - Abnormal; Notable for the following components:   Glucose, Bld 138 (*)    All other components within normal limits  URINALYSIS, COMPLETE (UACMP) WITH MICROSCOPIC - Abnormal; Notable for the following components:   Hgb urine dipstick LARGE (*)    RBC / HPF >50 (*)    All other components within normal limits    EKG None  Radiology No results found.  Procedures Procedures (including critical care time)  Medications Ordered in ED Medications - No data to display  ED Course  I have reviewed the triage vital signs and the nursing notes.  Pertinent labs & imaging results that were available during my care of the patient were reviewed by me and considered in my medical decision making (see chart for details).    MDM Rules/Calculators/A&P                          Harry Garcia is a 79 y.o. male here presenting with urinary retention.  Patient just had prostate biopsy earlier today.  Now he is in retention.  Will place Foley and get CBC and BMP and urinalysis.  8:19 PM Cr is normal. UA showed just blood but no UTI. Stable for discharge. Will leave foley in until he follows up with urology.   Final Clinical Impression(s) / ED Diagnoses Final diagnoses:  None    Rx / DC Orders ED Discharge Orders    None       Drenda Freeze, MD 08/07/19 2019

## 2019-08-13 DIAGNOSIS — Z9289 Personal history of other medical treatment: Secondary | ICD-10-CM | POA: Diagnosis not present

## 2019-08-29 DIAGNOSIS — R69 Illness, unspecified: Secondary | ICD-10-CM | POA: Diagnosis not present

## 2019-10-08 DIAGNOSIS — R69 Illness, unspecified: Secondary | ICD-10-CM | POA: Diagnosis not present

## 2019-11-04 DIAGNOSIS — Z1211 Encounter for screening for malignant neoplasm of colon: Secondary | ICD-10-CM | POA: Diagnosis not present

## 2019-12-10 DIAGNOSIS — R69 Illness, unspecified: Secondary | ICD-10-CM | POA: Diagnosis not present

## 2020-01-21 DIAGNOSIS — R3912 Poor urinary stream: Secondary | ICD-10-CM | POA: Diagnosis not present

## 2020-01-21 DIAGNOSIS — N401 Enlarged prostate with lower urinary tract symptoms: Secondary | ICD-10-CM | POA: Diagnosis not present

## 2020-01-21 DIAGNOSIS — R972 Elevated prostate specific antigen [PSA]: Secondary | ICD-10-CM | POA: Diagnosis not present

## 2020-02-20 DIAGNOSIS — Z20822 Contact with and (suspected) exposure to covid-19: Secondary | ICD-10-CM | POA: Diagnosis not present

## 2020-05-18 DIAGNOSIS — H16142 Punctate keratitis, left eye: Secondary | ICD-10-CM | POA: Diagnosis not present

## 2020-05-27 DIAGNOSIS — H16142 Punctate keratitis, left eye: Secondary | ICD-10-CM | POA: Diagnosis not present

## 2020-06-18 DIAGNOSIS — R972 Elevated prostate specific antigen [PSA]: Secondary | ICD-10-CM | POA: Diagnosis not present

## 2020-06-25 DIAGNOSIS — N401 Enlarged prostate with lower urinary tract symptoms: Secondary | ICD-10-CM | POA: Diagnosis not present

## 2020-06-25 DIAGNOSIS — R972 Elevated prostate specific antigen [PSA]: Secondary | ICD-10-CM | POA: Diagnosis not present

## 2020-06-25 DIAGNOSIS — N5201 Erectile dysfunction due to arterial insufficiency: Secondary | ICD-10-CM | POA: Diagnosis not present

## 2020-06-25 DIAGNOSIS — R3912 Poor urinary stream: Secondary | ICD-10-CM | POA: Diagnosis not present

## 2020-09-02 DIAGNOSIS — Z8379 Family history of other diseases of the digestive system: Secondary | ICD-10-CM | POA: Diagnosis not present

## 2020-09-02 DIAGNOSIS — I10 Essential (primary) hypertension: Secondary | ICD-10-CM | POA: Diagnosis not present

## 2020-09-02 DIAGNOSIS — E7800 Pure hypercholesterolemia, unspecified: Secondary | ICD-10-CM | POA: Diagnosis not present

## 2020-09-02 DIAGNOSIS — Z1211 Encounter for screening for malignant neoplasm of colon: Secondary | ICD-10-CM | POA: Diagnosis not present

## 2020-09-02 DIAGNOSIS — Z79899 Other long term (current) drug therapy: Secondary | ICD-10-CM | POA: Diagnosis not present

## 2020-09-02 DIAGNOSIS — J309 Allergic rhinitis, unspecified: Secondary | ICD-10-CM | POA: Diagnosis not present

## 2020-09-02 DIAGNOSIS — N528 Other male erectile dysfunction: Secondary | ICD-10-CM | POA: Diagnosis not present

## 2020-09-02 DIAGNOSIS — Z0000 Encounter for general adult medical examination without abnormal findings: Secondary | ICD-10-CM | POA: Diagnosis not present

## 2020-09-02 DIAGNOSIS — R972 Elevated prostate specific antigen [PSA]: Secondary | ICD-10-CM | POA: Diagnosis not present

## 2020-09-02 DIAGNOSIS — E559 Vitamin D deficiency, unspecified: Secondary | ICD-10-CM | POA: Diagnosis not present

## 2020-09-04 DIAGNOSIS — Z1211 Encounter for screening for malignant neoplasm of colon: Secondary | ICD-10-CM | POA: Diagnosis not present

## 2021-01-27 DIAGNOSIS — Z23 Encounter for immunization: Secondary | ICD-10-CM | POA: Diagnosis not present

## 2021-01-27 DIAGNOSIS — H01131 Eczematous dermatitis of right upper eyelid: Secondary | ICD-10-CM | POA: Diagnosis not present

## 2021-02-12 DIAGNOSIS — R972 Elevated prostate specific antigen [PSA]: Secondary | ICD-10-CM | POA: Diagnosis not present

## 2021-02-18 DIAGNOSIS — N401 Enlarged prostate with lower urinary tract symptoms: Secondary | ICD-10-CM | POA: Diagnosis not present

## 2021-02-18 DIAGNOSIS — R3912 Poor urinary stream: Secondary | ICD-10-CM | POA: Diagnosis not present

## 2021-02-18 DIAGNOSIS — R972 Elevated prostate specific antigen [PSA]: Secondary | ICD-10-CM | POA: Diagnosis not present

## 2021-03-03 ENCOUNTER — Other Ambulatory Visit: Payer: Self-pay | Admitting: Urology

## 2021-03-03 DIAGNOSIS — R972 Elevated prostate specific antigen [PSA]: Secondary | ICD-10-CM

## 2021-03-21 ENCOUNTER — Other Ambulatory Visit: Payer: Self-pay

## 2021-03-21 ENCOUNTER — Ambulatory Visit
Admission: RE | Admit: 2021-03-21 | Discharge: 2021-03-21 | Disposition: A | Discharge status: Home / Self Care | Payer: Medicare HMO | Source: Ambulatory Visit | Attending: Urology | Admitting: Urology

## 2021-03-21 DIAGNOSIS — R972 Elevated prostate specific antigen [PSA]: Secondary | ICD-10-CM

## 2021-03-21 MED ORDER — GADOBENATE DIMEGLUMINE 529 MG/ML IV SOLN
15.0000 mL | Freq: Once | INTRAVENOUS | Status: AC | PRN
  Administered 2021-03-21: 15 mL via INTRAVENOUS

## 2022-06-14 ENCOUNTER — Other Ambulatory Visit: Payer: Self-pay | Admitting: Urology

## 2022-06-14 DIAGNOSIS — R972 Elevated prostate specific antigen [PSA]: Secondary | ICD-10-CM

## 2022-07-12 ENCOUNTER — Ambulatory Visit
Admission: RE | Admit: 2022-07-12 | Discharge: 2022-07-12 | Disposition: A | Discharge status: Home / Self Care | Payer: Medicare Other | Source: Ambulatory Visit | Attending: Urology | Admitting: Urology

## 2022-07-12 DIAGNOSIS — R972 Elevated prostate specific antigen [PSA]: Secondary | ICD-10-CM

## 2022-07-12 MED ORDER — GADOPICLENOL 0.5 MMOL/ML IV SOLN
9.0000 mL | Freq: Once | INTRAVENOUS | Status: AC | PRN
  Administered 2022-07-12: 9 mL via INTRAVENOUS

## 2022-12-09 ENCOUNTER — Other Ambulatory Visit: Payer: Self-pay | Admitting: Internal Medicine

## 2022-12-09 DIAGNOSIS — E210 Primary hyperparathyroidism: Secondary | ICD-10-CM

## 2023-01-02 ENCOUNTER — Other Ambulatory Visit (HOSPITAL_COMMUNITY): Payer: Self-pay | Admitting: Urology

## 2023-01-02 DIAGNOSIS — C61 Malignant neoplasm of prostate: Secondary | ICD-10-CM

## 2023-01-13 ENCOUNTER — Encounter (HOSPITAL_COMMUNITY)
Admission: RE | Admit: 2023-01-13 | Discharge: 2023-01-13 | Disposition: A | Discharge status: Home / Self Care | Payer: Medicare Other | Source: Ambulatory Visit | Attending: Urology | Admitting: Urology

## 2023-01-13 DIAGNOSIS — C61 Malignant neoplasm of prostate: Secondary | ICD-10-CM | POA: Diagnosis present

## 2023-01-13 MED ORDER — FLOTUFOLASTAT F 18 GALLIUM 296-5846 MBQ/ML IV SOLN
7.8900 | Freq: Once | INTRAVENOUS | Status: AC
  Administered 2023-01-13: 7.89 via INTRAVENOUS

## 2023-01-25 ENCOUNTER — Encounter: Payer: Self-pay | Admitting: Radiation Oncology

## 2023-01-25 NOTE — Progress Notes (Signed)
GU Location of Tumor / Histology: Prostate Ca  If Prostate Cancer, Gleason Score is (4 + 4) and PSA is (29.20 on 11/08/2022)  Biopsy    01/13/2023 Dr. Jerilee Field NM PET (PSMA) Skull to Mid Thigh CLNICAL DATA: Prostate carcinoma.   IMPRESSION: 1. Two foci of intense radiotracer activity in the prostate gland consistent with primary prostate adenocarcinoma. 2. No evidence of metastatic adenopathy in the pelvis or periaortic retroperitoneum. 3. No evidence of visceral metastasis or skeletal metastasis. 4. LEFT lower lobe pulmonary nodule favored unrelated to prostate carcinoma. Recommend follow-up CT in 12 months if patient has smoking history.   07/12/2022 Dr. Jerilee Field MR Prostate with/without Contrast CLINICAL DATA: Elevated PSA level of 27.80 on 06/02/2022.   IMPRESSION: 1. PI-RADS category 5 lesion of the bilateral posteromedial peripheral zone in the apex, and PI-RADS category 4 lesion of the left posterolateral peripheral zone in the mid gland. Targeting data sent to [facility]. 2. Region of interest # 2 (the PI-RADS category 5 lesion) causes mild posterior bulging of the prostate capsule which can be an indicator of early transcapsular spread. 3. Prostatomegaly and benign prostatic hypertrophy. 4. Sigmoid colon diverticulosis.  Past/Anticipated interventions by urology, if any: NA  Past/Anticipated interventions by medical oncology, if any: NA  Weight changes, if any: No  IPSS:  1 SHIM: 21  Bowel/Bladder complaints, if any:  No  Nausea/Vomiting, if any: No  Pain issues, if any:  0/10  SAFETY ISSUES: Prior radiation?  No Pacemaker/ICD? No Possible current pregnancy? Male Is the patient on methotrexate? No  Current Complaints / other details:

## 2023-01-30 DIAGNOSIS — C61 Malignant neoplasm of prostate: Secondary | ICD-10-CM | POA: Insufficient documentation

## 2023-01-30 NOTE — Progress Notes (Signed)
Radiation Oncology         [phone] ________________________________  Initial Outpatient Consultation - Conducted via telephone due to current COVID-19 concerns for limiting patient exposure  Name: Harry Garcia MRN: [id number]  Date: 01/31/2023  DOB: 10-04-1940  WU:[id number], Dibas, MD  Jerilee Field, MD   REFERRING PHYSICIAN: Jerilee Field, MD  DIAGNOSIS: 82 y.o. gentleman with Stage T2b adenocarcinoma of the prostate with Gleason score of 4+4, and PSA of 29.2.  No diagnosis found.  HISTORY OF PRESENT ILLNESS: Harry Garcia is a 82 y.o. male with a diagnosis of prostate cancer. He has a history of an elevated PSA dating back to earlier than 2005. He had two biopsies in [place], one in 2005, 2020, and a MRI fusion biopsy in 2021. All biopsies were negative. His PSA was 5.61 in 2005 and has risen over time. His digital rectal examination has also shown a stable left apical nodule. More recently, his PSA in 05/2022 was 27.8. He underwent repeat surveillance prostate MRI on 07/12/22 showing: PI-RADS 5 lesion of bilateral posteromedial peripheral zone in apex (ROI #2); PI-RADS 4 lesion of left posterolateral peripheral zone in mid gland, corresponds to previous ROI (ROI #1); ROI #2 causes mild posterior bulging of prostate capsule. He declined to proceed with a repeat biopsy at that time. He returned for follow up in 10/2022, and PSA showed a rise to 29.2. The patient opted to proceed with MRI fusion biopsy of the prostate on 12/22/22.  The prostate volume measured 212.92 cc.  Out of 18 core biopsies, 6 were positive.  The maximum Gleason score was 4+4, and this was seen in all three samples from ROI #2, left mid (small focus), and right base. Additionally, Gleason 4+3 was seen in left base (with perineural invasion). Of note, ROI #1 was again benign, showing only a small focus of atypical glands.  He underwent staging PSMA PET scan on 01/13/23 showing: two foci of intense radiotracer activity in  prostate gland; no evidence of metastatic adenopathy, visceral metastasis, or skeletal metastasis; left lower lobe pulmonary nodule favored unrelated to prostate carcinoma. He was started on ADT with orgovyx at follow up with Dr. Mena Goes on 01/18/23.  The patient reviewed the biopsy results with his urologist and he has kindly been referred today for discussion of potential radiation treatment options.   PREVIOUS RADIATION THERAPY: No  PAST MEDICAL HISTORY:  Past Medical History:  Diagnosis Date   BPH (benign prostatic hyperplasia)    ED (erectile dysfunction)    Elevated PSA    Hypertension    Weak urinary stream       PAST SURGICAL HISTORY: Past Surgical History:  Procedure Laterality Date   PROSTATE BIOPSY      FAMILY HISTORY:  Family History  Problem Relation Age of Onset   Heart disease Father     SOCIAL HISTORY:  Social History   Socioeconomic History   Marital status: Married    Spouse name: Not on file   Number of children: Not on file   Years of education: Not on file   Highest education level: Not on file  Occupational History   Not on file  Tobacco Use   Smoking status: Never   Smokeless tobacco: Never  Substance and Sexual Activity   Alcohol use: No   Drug use: No   Sexual activity: Not on file  Other Topics Concern   Not on file  Social History Narrative   Not on file   Social Determinants of Health  Financial Resource Strain: Not on file  Food Insecurity: Not on file  Transportation Needs: Not on file  Physical Activity: Not on file  Stress: Not on file  Social Connections: Not on file  Intimate Partner Violence: Not on file    ALLERGIES: Patient has no known allergies.  MEDICATIONS:  Current Outpatient Medications  Medication Sig Dispense Refill   aspirin (ASPIRIN EC) 81 MG EC tablet Take 81 mg by mouth at bedtime. Swallow whole.      cholecalciferol (VITAMIN D) 1000 UNITS tablet Take 2,000 Units by mouth daily.       lisinopril-hydrochlorothiazide (ZESTORETIC) 10-12.5 MG tablet Take 1 tablet by mouth daily.      Misc Natural Products (BETA-SITOSTEROL PLANT STEROLS) CAPS Take 1 capsule by mouth daily.     Multiple Vitamins-Minerals (MULTIVITAMIN WITH MINERALS) tablet Take 1 tablet by mouth daily.     nitroGLYCERIN (NITRODUR - DOSED IN MG/24 HR) 0.2 mg/hr patch USE 1/4 PATCH TO THE AFFECTED AREA EVERY 24 HOURS (Patient not taking: Reported on 08/07/2019) 30 patch 1   simvastatin (ZOCOR) 5 MG tablet Take 5 mg by mouth daily.      tamsulosin (FLOMAX) 0.4 MG CAPS capsule Take 0.4 mg by mouth daily.     No current facility-administered medications for this encounter.    REVIEW OF SYSTEMS:  On review of systems, the patient reports that he is doing well overall. He denies any chest pain, shortness of breath, cough, fevers, chills, night sweats, unintended weight changes. He denies any bowel disturbances, and denies abdominal pain, nausea or vomiting. He denies any new musculoskeletal or joint aches or pains. His IPSS was ***, indicating *** urinary symptoms. His SHIM was ***, indicating he {does not have/has mild/moderate/severe} erectile dysfunction. A complete review of systems is obtained and is otherwise negative.    PHYSICAL EXAM:  Wt Readings from Last 3 Encounters:  10/10/12 178 lb (80.7 kg)  11/30/11 180 lb (81.6 kg)  10/06/11 175 lb (79.4 kg)   Temp Readings from Last 3 Encounters:  08/07/19 98 F (36.7 C) (Oral)  07/13/11 98.5 F (36.9 C) (Oral)   BP Readings from Last 3 Encounters:  08/07/19 (!) 142/70  10/10/12 137/84  11/30/11 134/82   Pulse Readings from Last 3 Encounters:  08/07/19 81  10/10/12 73  10/06/11 73    /10  In general this is a well appearing *** male in no acute distress. He's alert and oriented x4 and appropriate throughout the examination. Cardiopulmonary assessment is negative for acute distress, and he exhibits normal effort.     KPS = ***  100 - Normal; no  complaints; no evidence of disease. 90   - Able to carry on normal activity; minor signs or symptoms of disease. 80   - Normal activity with effort; some signs or symptoms of disease. 86   - Cares for self; unable to carry on normal activity or to do active work. 60   - Requires occasional assistance, but is able to care for most of his personal needs. 50   - Requires considerable assistance and frequent medical care. 40   - Disabled; requires special care and assistance. 30   - Severely disabled; hospital admission is indicated although death not imminent. 20   - Very sick; hospital admission necessary; active supportive treatment necessary. 10   - Moribund; fatal processes progressing rapidly. 0     - Dead  Karnofsky DA, Abelmann WH, Craver LS and Burchenal [facility] [phone]) The use of  the nitrogen mustards in the palliative treatment of carcinoma: with particular reference to bronchogenic carcinoma Cancer 1 [id number]  LABORATORY DATA:  Lab Results  Component Value Date   WBC 13.4 (H) 08/07/2019   HGB 15.6 08/07/2019   HCT 46.5 08/07/2019   MCV 92.1 08/07/2019   PLT 407 (H) 08/07/2019   Lab Results  Component Value Date   NA 136 08/07/2019   K 3.7 08/07/2019   CL 102 08/07/2019   CO2 26 08/07/2019   No results found for: "ALT", "AST", "GGT", "ALKPHOS", "BILITOT"   RADIOGRAPHY: NM PET (PSMA) SKULL TO MID THIGH  Result Date: 01/17/2023 CLINICAL DATA:  Prostate carcinoma. EXAM: NUCLEAR MEDICINE PET SKULL BASE TO THIGH TECHNIQUE: 7.9 mCi Flotufolastat (Posluma) was injected intravenously. Full-ring PET imaging was performed from the skull base to thigh after the radiotracer. CT data was obtained and used for attenuation correction and anatomic localization. COMPARISON:  Prostate MRI 07/12/2018 FINDINGS: NECK No radiotracer activity in neck lymph nodes. Incidental CT finding: None. CHEST No radiotracer avid mediastinal or supraclavicular nodes. Nodule in the LEFT lower lobe measures 7 mm (image  46/7) does not have radiotracer activity. Incidental CT finding: None. ABDOMEN/PELVIS Prostate: Intense radiotracer avid lesion posterior midline peripheral zone towards the apex with SUV max equal 43.8 on image 191. This corresponds to suspicious nodule on comparison prostate MRI. Second focus intense radiotracer activity in the posterior LEFT mid gland peripheral zone SUV max equal 16.8 on image 186. Lymph nodes: No abnormal radiotracer accumulation within pelvic or abdominal nodes. Liver: No evidence of liver metastasis. Incidental CT finding: None. SKELETON No focal activity to suggest skeletal metastasis. IMPRESSION: 1. Two foci of intense radiotracer activity in the prostate gland consistent with primary prostate adenocarcinoma. 2. No evidence of metastatic adenopathy in the pelvis or periaortic retroperitoneum. 3. No evidence of visceral metastasis or skeletal metastasis. 4. LEFT lower lobe pulmonary nodule favored unrelated to prostate carcinoma. Recommend follow-up CT in 12 months if patient has smoking history. Electronically Signed   By: Genevive Bi M.D.   On: 01/17/2023 15:05      IMPRESSION/PLAN: This visit was conducted via telephone to spare the patient unnecessary potential exposure in the healthcare setting during the current COVID-19 pandemic. 1. 82 y.o. gentleman with Stage T2b adenocarcinoma of the prostate with Gleason Score of 4+4, and PSA of 29.2. We discussed the patient's workup and outlined the nature of prostate cancer in this setting. The patient's T stage, Gleason's score, and PSA put him into the high risk group. Accordingly, he is eligible for a variety of potential treatment options including LT-ADT concurrent with 8 weeks of external radiation, 5 weeks of external radiation with an upfront brachytherapy boost, or prostatectomy. We discussed the available radiation techniques, and focused on the details and logistics of delivery. The patient is not an ideal candidate for  brachytherapy boost with a prostate volume of 212 cc. We discussed and outlined the risks, benefits, short and long-term effects associated with radiotherapy and compared and contrasted these with prostatectomy. We discussed the role of SpaceOAR gel in reducing the rectal toxicity associated with radiotherapy. We also detailed the role of ADT in the treatment of high risk prostate cancer and outlined the associated side effects that could be expected with this therapy. He appears to have a good understanding of his disease and our treatment recommendations which are of curative intent.  He was encouraged to ask questions that were answered to his stated satisfaction.  At the conclusion of  our conversation, the patient is interested in moving forward with ***.    Given current concerns for patient exposure during the COVID-19 pandemic, this encounter was conducted via telephone. The patient was notified in advance and was offered a WebEX meeting to allow for face to face communication but unfortunately reported that he did not have the appropriate resources/technology to support such a visit and instead preferred to proceed with telephone consult. The patient has given verbal consent for this type of encounter. The time spent during this encounter was *** minutes. The attendants for this meeting include Margaretmary Dys MD, Jordynn Perrier PA-C, patient Belva Bertin {and ***.} During the encounter, Margaretmary Dys MD and Marcello Fennel PA-C were located at [facility] Radiation Oncology Department.  Patient Blayden Polt {and *** were} was located at home.     Marguarite Arbour, PA-C    Margaretmary Dys, MD  [facility]  Radiation Oncology Direct Dial: [phone]  Fax: [phone] [email or web address]  Skype  [email or web address]   This document serves as a record of services personally performed by Margaretmary Dys, MD and Marcello Fennel, PA-C. It was created on their behalf by Mickie Bail, a trained medical scribe. The creation of this record is based on the scribe's personal observations and the provider's statements to them. This document has been checked and approved by the attending provider.

## 2023-01-31 ENCOUNTER — Ambulatory Visit
Admission: RE | Admit: 2023-01-31 | Discharge: 2023-01-31 | Disposition: A | Discharge status: Home / Self Care | Payer: Medicare Other | Source: Ambulatory Visit | Attending: Radiation Oncology | Admitting: Radiation Oncology

## 2023-01-31 ENCOUNTER — Encounter: Payer: Self-pay | Admitting: Radiation Oncology

## 2023-01-31 ENCOUNTER — Other Ambulatory Visit: Payer: Self-pay

## 2023-01-31 VITALS — Ht 70.0 in | Wt 171.0 lb

## 2023-01-31 DIAGNOSIS — C61 Malignant neoplasm of prostate: Secondary | ICD-10-CM

## 2023-01-31 DIAGNOSIS — R911 Solitary pulmonary nodule: Secondary | ICD-10-CM | POA: Insufficient documentation

## 2023-01-31 HISTORY — DX: Poor urinary stream: R39.12

## 2023-01-31 HISTORY — DX: Male erectile dysfunction, unspecified: N52.9

## 2023-01-31 HISTORY — DX: Elevated prostate specific antigen (PSA): R97.20

## 2023-01-31 HISTORY — DX: Benign prostatic hyperplasia without lower urinary tract symptoms: N40.0

## 2023-02-02 ENCOUNTER — Telehealth: Payer: Self-pay | Admitting: *Deleted

## 2023-02-02 ENCOUNTER — Other Ambulatory Visit: Payer: Self-pay | Admitting: Urology

## 2023-02-02 NOTE — Telephone Encounter (Signed)
Called patient to inform of fid. marker and space oar placement on  04-04-23 and his sim on 04-06-23-arrival time- 7:45 am @ [facility], informed patient to arrive with a full bladder, lvm for a return call

## 2023-02-07 NOTE — Progress Notes (Signed)
Spoke with patient via telephone to introduce myself as the prostate nurse navigator and discussed my role.  Patient started Orgovyx on 12/10.  RN provided education on treatment recommendations of LT-ADT and 8 weeks of radiation.  Patient did report he is getting up approximately 5 times to urinate, no difficulty starting stream, and feels like he isn't emptying his bladder.  Patient is on Tamsulosin 0.4mg  daily, and reports taking in the earlier part of day.  RN encouraged patient to take one hour prior to bedtime.  Pt will try this and provide update X 1 week.  Direct number provided.   Plan of care in progress.

## 2023-03-26 ENCOUNTER — Encounter: Payer: Self-pay | Admitting: Radiation Oncology

## 2023-03-30 ENCOUNTER — Encounter (HOSPITAL_BASED_OUTPATIENT_CLINIC_OR_DEPARTMENT_OTHER): Payer: Self-pay | Admitting: Urology

## 2023-03-30 NOTE — Progress Notes (Signed)
 Spoke w/ via phone for pre-op interview--- Harry Garcia needs dos---- EKG and ISTAT per anesthesia        Lab results------ COVID test -----patient states asymptomatic no test needed Arrive at -------0530 NPO after MN NO Solid Food.   Med rec completed Medications to take morning of surgery -----NONE Diabetic medication ----- Patient instructed no nail polish to be worn day of surgery Patient instructed to bring photo id and insurance card day of surgery Patient aware to have Driver (ride ) / caregiver    for 24 hours after surgery - Wife Harry Garcia Patient Special Instructions ----- Fleets enema prior to procedure, pt verbalized understanding. Pre-Op special Instructions ----- Patient verbalized understanding of instructions that were given at this phone interview. Patient denies chest pain, sob, fever, cough at the interview.

## 2023-04-03 NOTE — Anesthesia Preprocedure Evaluation (Addendum)
Anesthesia Evaluation  Patient identified by MRN, date of birth, ID band Patient awake    Reviewed: Allergy & Precautions, NPO status , Patient's Chart, lab work & pertinent test results  Airway Mallampati: II  TM Distance: >3 FB Neck ROM: Full    Dental no notable dental hx. (+) Teeth Intact, Dental Advisory Given   Pulmonary neg pulmonary ROS   Pulmonary exam normal breath sounds clear to auscultation       Cardiovascular hypertension, Normal cardiovascular exam Rhythm:Regular Rate:Normal     Neuro/Psych negative neurological ROS  negative psych ROS   GI/Hepatic negative GI ROS, Neg liver ROS,,,  Endo/Other  negative endocrine ROS    Renal/GU negative Renal ROS   Prostate CA    Musculoskeletal   Abdominal   Peds  Hematology negative hematology ROS (+)   Anesthesia Other Findings   Reproductive/Obstetrics                             Anesthesia Physical Anesthesia Plan  ASA: 2  Anesthesia Plan: MAC   Post-op Pain Management: Toradol IV (intra-op)*   Induction: Intravenous  PONV Risk Score and Plan: 1 and Treatment may vary due to age or medical condition, Propofol infusion and Ondansetron  Airway Management Planned: Natural Airway and Nasal Cannula  Additional Equipment: None  Intra-op Plan:   Post-operative Plan:   Informed Consent: I have reviewed the patients History and Physical, chart, labs and discussed the procedure including the risks, benefits and alternatives for the proposed anesthesia with the patient or authorized representative who has indicated his/her understanding and acceptance.     Dental advisory given  Plan Discussed with: CRNA and Anesthesiologist  Anesthesia Plan Comments:        Anesthesia Quick Evaluation

## 2023-04-04 ENCOUNTER — Ambulatory Visit (HOSPITAL_BASED_OUTPATIENT_CLINIC_OR_DEPARTMENT_OTHER): Payer: Medicare Other | Admitting: Anesthesiology

## 2023-04-04 ENCOUNTER — Ambulatory Visit (HOSPITAL_BASED_OUTPATIENT_CLINIC_OR_DEPARTMENT_OTHER)
Admission: RE | Admit: 2023-04-04 | Discharge: 2023-04-04 | Disposition: A | Discharge status: Home / Self Care | Payer: Medicare Other | Attending: Urology | Admitting: Urology

## 2023-04-04 ENCOUNTER — Encounter (HOSPITAL_BASED_OUTPATIENT_CLINIC_OR_DEPARTMENT_OTHER)
Admission: RE | Disposition: A | Discharge status: Home / Self Care | Payer: Self-pay | Source: Home / Self Care | Attending: Urology

## 2023-04-04 ENCOUNTER — Encounter (HOSPITAL_BASED_OUTPATIENT_CLINIC_OR_DEPARTMENT_OTHER): Payer: Self-pay | Admitting: Urology

## 2023-04-04 ENCOUNTER — Other Ambulatory Visit: Payer: Self-pay

## 2023-04-04 DIAGNOSIS — C61 Malignant neoplasm of prostate: Secondary | ICD-10-CM

## 2023-04-04 DIAGNOSIS — I10 Essential (primary) hypertension: Secondary | ICD-10-CM | POA: Diagnosis not present

## 2023-04-04 HISTORY — PX: SPACE OAR INSTILLATION: SHX6769

## 2023-04-04 HISTORY — PX: GOLD SEED IMPLANT: SHX6343

## 2023-04-04 HISTORY — DX: Malignant (primary) neoplasm, unspecified: C80.1

## 2023-04-04 LAB — POCT I-STAT, CHEM 8
BUN: 19 mg/dL (ref 8–23)
Calcium, Ion: 1.46 mmol/L — ABNORMAL HIGH (ref 1.15–1.40)
Chloride: 105 mmol/L (ref 98–111)
Creatinine, Ser: 0.9 mg/dL (ref 0.61–1.24)
Glucose, Bld: 114 mg/dL — ABNORMAL HIGH (ref 70–99)
HCT: 47 % (ref 39.0–52.0)
Hemoglobin: 16 g/dL (ref 13.0–17.0)
Potassium: 4.2 mmol/L (ref 3.5–5.1)
Sodium: 140 mmol/L (ref 135–145)
TCO2: 24 mmol/L (ref 22–32)

## 2023-04-04 SURGERY — INSERTION, GOLD SEEDS
Anesthesia: Monitor Anesthesia Care | Site: Prostate

## 2023-04-04 MED ORDER — CEFAZOLIN SODIUM-DEXTROSE 2-4 GM/100ML-% IV SOLN
INTRAVENOUS | Status: AC
  Filled 2023-04-04: qty 100

## 2023-04-04 MED ORDER — FENTANYL CITRATE (PF) 100 MCG/2ML IJ SOLN: 25.0000 ug | INTRAMUSCULAR | Status: DC | PRN

## 2023-04-04 MED ORDER — PROPOFOL 500 MG/50ML IV EMUL
INTRAVENOUS | Status: DC | PRN
  Administered 2023-04-04: 150 ug/kg/min via INTRAVENOUS

## 2023-04-04 MED ORDER — LIDOCAINE HCL (PF) 1 % IJ SOLN
INTRAMUSCULAR | Status: DC | PRN
  Administered 2023-04-04: 15 mL

## 2023-04-04 MED ORDER — FLEET ENEMA RE ENEM: 1.0000 | ENEMA | Freq: Once | RECTAL | Status: DC

## 2023-04-04 MED ORDER — ONDANSETRON HCL 4 MG/2ML IJ SOLN: 4.0000 mg | Freq: Once | INTRAMUSCULAR | Status: DC | PRN

## 2023-04-04 MED ORDER — ACETAMINOPHEN 10 MG/ML IV SOLN: 1000.0000 mg | Freq: Once | INTRAVENOUS | Status: DC | PRN

## 2023-04-04 MED ORDER — PROPOFOL 10 MG/ML IV BOLUS
INTRAVENOUS | Status: DC | PRN
  Administered 2023-04-04: 20 mg via INTRAVENOUS
  Administered 2023-04-04: 50 mg via INTRAVENOUS

## 2023-04-04 MED ORDER — PROPOFOL 500 MG/50ML IV EMUL
INTRAVENOUS | Status: AC
  Filled 2023-04-04: qty 50

## 2023-04-04 MED ORDER — FENTANYL CITRATE (PF) 100 MCG/2ML IJ SOLN
INTRAMUSCULAR | Status: DC | PRN
  Administered 2023-04-04 (×2): 25 ug via INTRAVENOUS

## 2023-04-04 MED ORDER — LACTATED RINGERS IV SOLN: INTRAVENOUS | Status: DC

## 2023-04-04 MED ORDER — CEFAZOLIN SODIUM-DEXTROSE 2-4 GM/100ML-% IV SOLN
2.0000 g | INTRAVENOUS | Status: AC
  Administered 2023-04-04: 2 g via INTRAVENOUS

## 2023-04-04 MED ORDER — SODIUM CHLORIDE (PF) 0.9 % IJ SOLN
INTRAMUSCULAR | Status: DC | PRN
  Administered 2023-04-04: 10 mL

## 2023-04-04 MED ORDER — FENTANYL CITRATE (PF) 100 MCG/2ML IJ SOLN
INTRAMUSCULAR | Status: AC
Start: 2023-04-04 — End: ?
  Filled 2023-04-04: qty 2

## 2023-04-04 SURGICAL SUPPLY — 25 items
BLADE CLIPPER SENSICLIP SURGIC (BLADE) ×1 IMPLANT
CNTNR URN SCR LID CUP LEK RST (MISCELLANEOUS) ×1 IMPLANT
COVER BACK TABLE 60X90IN (DRAPES) ×1 IMPLANT
DRSG TEGADERM 4X4.75 (GAUZE/BANDAGES/DRESSINGS) ×1 IMPLANT
DRSG TEGADERM 8X12 (GAUZE/BANDAGES/DRESSINGS) ×1 IMPLANT
GAUZE SPONGE 4X4 12PLY STRL (GAUZE/BANDAGES/DRESSINGS) ×1 IMPLANT
GLOVE BIO SURGEON STRL SZ7 (GLOVE) IMPLANT
GLOVE BIO SURGEON STRL SZ7.5 (GLOVE) ×1 IMPLANT
GLOVE BIOGEL PI IND STRL 7.0 (GLOVE) IMPLANT
IMPL SPACEOAR VUE SYSTEM (Spacer) ×1 IMPLANT
IMPLANT SPACEOAR VUE SYSTEM (Spacer) ×1 IMPLANT
KIT TURNOVER CYSTO (KITS) ×1 IMPLANT
MARKER GOLD PRELOAD 1.2X3 (Urological Implant) ×1 IMPLANT
MARKER SKIN DUAL TIP RULER LAB (MISCELLANEOUS) ×1 IMPLANT
NDL SPNL 22GX3.5 QUINCKE BK (NEEDLE) ×1 IMPLANT
NEEDLE SPNL 22GX3.5 QUINCKE BK (NEEDLE) ×1 IMPLANT
SEED GOLD PRELOAD 1.2X3 (Urological Implant) ×1 IMPLANT
SHEATH ULTRASOUND LTX NONSTRL (SHEATH) IMPLANT
SLEEVE SCD COMPRESS KNEE MED (STOCKING) ×1 IMPLANT
SPIKE FLUID TRANSFER (MISCELLANEOUS) IMPLANT
SURGILUBE 2OZ TUBE FLIPTOP (MISCELLANEOUS) ×1 IMPLANT
SYR 10ML LL (SYRINGE) IMPLANT
SYR CONTROL 10ML LL (SYRINGE) ×1 IMPLANT
TOWEL OR 17X24 6PK STRL BLUE (TOWEL DISPOSABLE) ×1 IMPLANT
UNDERPAD 30X36 HEAVY ABSORB (UNDERPADS AND DIAPERS) ×1 IMPLANT

## 2023-04-04 NOTE — H&P (Signed)
 H&P  Chief Complaint: High risk prostate cancer  History of Present Illness: Harry Garcia is an 83 year old male who underwent surveillance of an elevated PSA and was ultimately diagnosed with high risk prostate cancer.  He is undergoing long-term ADT.  He started ADT in December 2024.  He is preparing for 8 weeks of external beam radiation.  Presents for gold seed markers and SpaceOAR.  He has been well without dysuria or gross hematuria.  No cough, cold or congestion.  Past Medical History:  Diagnosis Date   BPH (benign prostatic hyperplasia)    Cancer (HCC)    ED (erectile dysfunction)    Elevated PSA    Hypertension    Weak urinary stream    Past Surgical History:  Procedure Laterality Date   PROSTATE BIOPSY      Home Medications:  Medications Prior to Admission  Medication Sig Dispense Refill Last Dose/Taking   aspirin (ASPIRIN EC) 81 MG EC tablet Take 81 mg by mouth at bedtime. Swallow whole.    03/29/2023   lisinopril-hydrochlorothiazide (ZESTORETIC) 10-12.5 MG tablet Take 1 tablet by mouth daily.    04/03/2023   Multiple Vitamins-Minerals (MULTIVITAMIN WITH MINERALS) tablet Take 1 tablet by mouth daily.   03/28/2023   relugolix (ORGOVYX) 120 MG tablet Take 120 mg by mouth daily.   04/03/2023   tamsulosin (FLOMAX) 0.4 MG CAPS capsule Take 0.4 mg by mouth daily.   04/03/2023   Allergies: No Known Allergies  Family History  Problem Relation Age of Onset   Heart disease Father    Social History:  reports that he has never smoked. He has never used smokeless tobacco. He reports that he does not drink alcohol and does not use drugs.  ROS: A complete review of systems was performed.  All systems are negative except for pertinent findings as noted. Review of Systems  All other systems reviewed and are negative.    Physical Exam:  Vital signs in last 24 hours: Temp:  [97.5 F (36.4 C)] 97.5 F (36.4 C) (02/11 0556) Pulse Rate:  [92] 92 (02/11 0556) Resp:  [17] 17 (02/11 0556) BP:  (138)/(89) 138/89 (02/11 0556) SpO2:  [99 %] 99 % (02/11 0556) Weight:  [81.6 kg] 81.6 kg (02/11 0556) General:  Alert and oriented, No acute distress HEENT: Normocephalic, atraumatic Cardiovascular: Regular rate and rhythm Lungs: Regular rate and effort Abdomen: Soft, nontender, nondistended, no abdominal masses Back: No CVA tenderness Extremities: No edema Neurologic: Grossly intact  Laboratory Data:  Results for orders placed or performed during the hospital encounter of 04/04/23 (from the past 24 hours)  I-STAT, chem 8     Status: Abnormal   Collection Time: 04/04/23  6:13 AM  Result Value Ref Range   Sodium 140 135 - 145 mmol/L   Potassium 4.2 3.5 - 5.1 mmol/L   Chloride 105 98 - 111 mmol/L   BUN 19 8 - 23 mg/dL   Creatinine, Ser 1.61 0.61 - 1.24 mg/dL   Glucose, Bld 096 (H) 70 - 99 mg/dL   Calcium, Ion 0.45 (H) 1.15 - 1.40 mmol/L   TCO2 24 22 - 32 mmol/L   Hemoglobin 16.0 13.0 - 17.0 g/dL   HCT 40.9 81.1 - 91.4 %   No results found for this or any previous visit (from the past 240 hours). Creatinine: Recent Labs    04/04/23 7829  CREATININE 0.90    Impression/Assessment:  High risk prostate cancer-undergoing long-term ADT and set to begin 8 weeks of ADT-  Plan:  I discussed with the patient the nature, potential benefits, risks and alternatives to transrectal ultrasound-guided placement of gold seed markers and SpaceOAR biodegradable gel, including side effects of the proposed treatment, the likelihood of the patient achieving the goals of the procedure, and any potential problems that might occur during the procedure or recuperation.  Discussed risk of injury to bladder, urethra rectum among others. All questions answered. Patient elects to proceed.    Jerilee Field 04/04/2023, 7:33 AM

## 2023-04-04 NOTE — Anesthesia Postprocedure Evaluation (Signed)
Anesthesia Post Note  Patient: Harry Garcia  Procedure(s) Performed: GOLD SEED IMPLANT (Prostate) SPACE OAR INSTILLATION (Prostate)     Patient location during evaluation: PACU Anesthesia Type: MAC Level of consciousness: awake and alert Pain management: pain level controlled Vital Signs Assessment: post-procedure vital signs reviewed and stable Respiratory status: spontaneous breathing, nonlabored ventilation, respiratory function stable and patient connected to nasal cannula oxygen Cardiovascular status: stable and blood pressure returned to baseline Postop Assessment: no apparent nausea or vomiting Anesthetic complications: no  No notable events documented.  Last Vitals:  Vitals:   04/04/23 0830 04/04/23 0900  BP: 123/74 131/74  Pulse: 67 73  Resp: 13 17  Temp:  (!) 36.4 C  SpO2: 98% 97%    Last Pain:  Vitals:   04/04/23 0900  TempSrc:   PainSc: 0-No pain                 Trevor Iha

## 2023-04-04 NOTE — Transfer of Care (Signed)
Immediate Anesthesia Transfer of Care Note  Patient: Harry Garcia  Procedure(s) Performed: Procedure(s) (LRB): GOLD SEED IMPLANT (N/A) SPACE OAR INSTILLATION (N/A)  Patient Location: PACU  Anesthesia Type: MAC  Level of Consciousness: awake, alert , oriented and patient cooperative  Airway & Oxygen Therapy: Patient Spontanous Breathing room air  Post-op Assessment: Report given to PACU RN and Post -op Vital signs reviewed and stable  Post vital signs: Reviewed and stable  Complications: No apparent anesthesia complications Last Vitals:  Vitals Value Taken Time  BP 106/93 04/04/23 0816  Temp    Pulse 78 04/04/23 0817  Resp 21 04/04/23 0817  SpO2 92 % 04/04/23 0817  Vitals shown include unfiled device data.  Last Pain:  Vitals:   04/04/23 0556  TempSrc: Oral  PainSc: 0-No pain      Patients Stated Pain Goal: 4 (04/04/23 0556)  Complications: No notable events documented.

## 2023-04-04 NOTE — Discharge Instructions (Addendum)
Transrectal Ultrasound-Guided Prostate Gold Seed Markers and SpaceOar Gel, Care After What can I expect after the procedure? After the procedure, it is common to have: Pain and discomfort near your butt (rectum), especially while sitting. Pink-colored pee (urine). This is due to small amounts of blood in your pee. A burning feeling while peeing. Blood in your semen. Follow these instructions at home: Medicines Take over-the-counter and prescription medicines only as told by your doctor. If you were given a sedative during your procedure, do not drive or use machines until your doctor says that it is safe. A sedative is a medicine that helps you relax. If you were prescribed an antibiotic medicine, take it as told by your doctor. Do not stop taking it even if you start to feel better. Activity  Return to your normal activities when your doctor says that it is safe. Ask your doctor when it is okay for you to have sex. You may have to avoid lifting. Ask your doctor how much you can safely lift. General instructions  Drink enough water to keep your pee pale yellow. Watch your pee, poop, and semen for new bleeding or bleeding that gets worse. Keep all follow-up visits. Contact a doctor if: You have any of these: Blood clots in your pee or poop. New or worse bleeding in your pee, poop, or semen. Very bad belly pain. Your pee smells bad or unusual. You have trouble peeing. Your lower belly feels firm. You have problems getting an erection. You feel like you may vomit (are nauseous), or you vomit. Get help right away if: You have a fever or chills. You have bright red pee. You have very bad pain that does not get better with medicine. You cannot pee. Summary After this procedure, it is common to have pain and discomfort near your butt, especially while sitting. You may have blood in your pee and poop. It is common to have blood in your semen. Get help right away if you have a fever  or chills. This information is not intended to replace advice given to you by your health care provider. Make sure you discuss any questions you have with your health care provider. Document Revised: 08/03/2020 Document Reviewed: 08/03/2020 Elsevier Patient Education  2024 [employer] Inc.    Post Anesthesia Home Care Instructions  Activity: Get plenty of rest for the remainder of the day. A responsible individual must stay with you for 24 hours following the procedure.  For the next 24 hours, DO NOT: -Drive a car -Advertising copywriter -Drink alcoholic beverages -Take any medication unless instructed by your physician -Make any legal decisions or sign important papers.  Meals: Start with liquid foods such as gelatin or soup. Progress to regular foods as tolerated. Avoid greasy, spicy, heavy foods. If nausea and/or vomiting occur, drink only clear liquids until the nausea and/or vomiting subsides. Call your physician if vomiting continues.  Special Instructions/Symptoms: Your throat may feel dry or sore from the anesthesia or the breathing tube placed in your throat during surgery. If this causes discomfort, gargle with warm salt water. The discomfort should disappear within 24 hours.

## 2023-04-04 NOTE — Op Note (Signed)
Preoperative diagnosis: Prostate cancer Postoperative diagnosis: Prostate cancer  Procedure: Transrectal ultrasound-guided transperineal placement of gold seed prostate fiducial markers and SpaceOAR biodegradable gel  Surgeon: Mena Goes  Anesthesia: General  Indication for procedure: 83 yo male preparing to start external beam radiation.  He presents today for the above.  Findings: Normal-appearing prostate with intact capsule. Seminal vesicles appeared normal.  Description of procedure: After consent was obtained patient brought to the operating room.  After adequate anesthesia he was placed in lithotomy position and the scrotum supported superiorly.  The perineum was prepped.  Ultrasound probe inserted rectally.  Prostate imaged in the axial and sagittal views.  The perineum and pelvic floor were anesthetized with lidocaine.  The gold seed markers were passed transperineally under ultrasound guidance placing three total with one in the right base, right apex and left mid of the prostate gland.  The 18-gauge needle was then inserted approximately 1 to 2 cm anterior to the anal opening and directed under ultrasonic guidance into the perirectal fat between the anterior rectal wall and the prostate capsule down to the mid-gland. Midline needle position was confirmed in the sagittal and axial views to verify the tip was in the perirectal fat.  Small amounts of saline were injected to hydrodissect the space between the prostate and the anterior rectal wall.  Axial imaging was viewed to confirm the needle was in the correct location in the mid gland and centered.  Aspiration confirmed no intravascular access.  The saline syringe was carefully disconnected maintaining the desired needle position and the hydrogel was attached to the needle.  Under ultrasound guidance in the sagittal view a smooth continuous injection was done over about 12 seconds delivering the hydrogel into the space between the prostate  and rectal wall.  The needle was withdrawn. The [place] was removed. Post-procedure DRE noted no rectal injury and no blood on gloved finger.   A dressing was placed and the patient was awakened and taken to the cover room in stable condition.  Complications: None  Blood loss: Minimal  Specimens: None  Drains: None  Disposition: Patient stable to PACU

## 2023-04-04 NOTE — Anesthesia Procedure Notes (Signed)
Procedure Name: MAC Date/Time: 04/04/2023 7:48 AM  Performed by: Francie Massing, CRNAPre-anesthesia Checklist: Patient identified, Emergency Drugs available, Suction available, Timeout performed and Patient being monitored Patient Re-evaluated:Patient Re-evaluated prior to induction Oxygen Delivery Method: Simple face mask

## 2023-04-05 ENCOUNTER — Telehealth: Payer: Self-pay | Admitting: *Deleted

## 2023-04-05 ENCOUNTER — Encounter (HOSPITAL_BASED_OUTPATIENT_CLINIC_OR_DEPARTMENT_OTHER): Payer: Self-pay | Admitting: Urology

## 2023-04-05 NOTE — Progress Notes (Signed)
  Radiation Oncology         [phone]) [phone] ________________________________  Name: Harry Garcia MRN: [id number]  Date: 04/06/2023  DOB: Jul 27, 1940  SIMULATION AND TREATMENT PLANNING NOTE    ICD-10-CM   1. Malignant neoplasm of prostate (HCC)  C61       DIAGNOSIS:  83 y.o. gentleman with Stage T2b adenocarcinoma of the prostate with Gleason score of 4+4, and PSA of 29.2.   NARRATIVE:  The patient was brought to the CT Simulation planning suite.  Identity was confirmed.  All relevant records and images related to the planned course of therapy were reviewed.  The patient freely provided informed written consent to proceed with treatment after reviewing the details related to the planned course of therapy. The consent form was witnessed and verified by the simulation staff.  Then, the patient was set-up in a stable reproducible supine position for radiation therapy.  A vacuum lock pillow device was custom fabricated to position his legs in a reproducible immobilized position.  Then, I performed a urethrogram under sterile conditions to identify the prostatic apex.  CT images were obtained.  Surface markings were placed.  The CT images were loaded into the planning software.  Then the prostate target and avoidance structures including the rectum, bladder, bowel and hips were contoured.  Treatment planning then occurred.  The radiation prescription was entered and confirmed.  A total of one complex treatment devices was fabricated. I have requested : Intensity Modulated Radiotherapy (IMRT) is medically necessary for this case for the following reason:  Rectal sparing.Marland Kitchen  PLAN:   The prostate, seminal vesicles, and pelvic lymph nodes will initially be treated to 45 Gy in 25 fractions of 1.8 Gy followed by a boost to the prostate only, to 75 Gy with 15 additional fractions of 2.0 Gy   ________________________________  Artist Pais Kathrynn Running, M.D.

## 2023-04-05 NOTE — Telephone Encounter (Signed)
Called patient to remind of sim appt. for 04-06-23- arrival time- 7:45 am @ [facility], informed patient to arrive with a full bladder, spoke with patient and he is aware of this appt. and the instructions

## 2023-04-06 ENCOUNTER — Ambulatory Visit
Admission: RE | Admit: 2023-04-06 | Discharge: 2023-04-06 | Disposition: A | Discharge status: Home / Self Care | Payer: Medicare Other | Source: Ambulatory Visit | Attending: Urology | Admitting: Urology

## 2023-04-06 DIAGNOSIS — Z510 Encounter for antineoplastic radiation therapy: Secondary | ICD-10-CM | POA: Diagnosis present

## 2023-04-06 DIAGNOSIS — C61 Malignant neoplasm of prostate: Secondary | ICD-10-CM | POA: Diagnosis present

## 2023-04-13 DIAGNOSIS — Z510 Encounter for antineoplastic radiation therapy: Secondary | ICD-10-CM | POA: Diagnosis not present

## 2023-04-17 ENCOUNTER — Other Ambulatory Visit: Payer: Self-pay

## 2023-04-17 ENCOUNTER — Ambulatory Visit
Admission: RE | Admit: 2023-04-17 | Discharge: 2023-04-17 | Disposition: A | Discharge status: Home / Self Care | Payer: Medicare Other | Source: Ambulatory Visit | Attending: Radiation Oncology | Admitting: Radiation Oncology

## 2023-04-17 DIAGNOSIS — Z510 Encounter for antineoplastic radiation therapy: Secondary | ICD-10-CM | POA: Diagnosis not present

## 2023-04-17 LAB — RAD ONC ARIA SESSION SUMMARY
Course Elapsed Days: 0
Plan Fractions Treated to Date: 1
Plan Prescribed Dose Per Fraction: 1.8 Gy
Plan Total Fractions Prescribed: 25
Plan Total Prescribed Dose: 45 Gy
Reference Point Dosage Given to Date: 1.8 Gy
Reference Point Session Dosage Given: 1.8 Gy
Session Number: 1

## 2023-04-18 ENCOUNTER — Ambulatory Visit
Admission: RE | Admit: 2023-04-18 | Discharge: 2023-04-18 | Disposition: A | Discharge status: Home / Self Care | Payer: Medicare Other | Source: Ambulatory Visit | Attending: Radiation Oncology | Admitting: Radiation Oncology

## 2023-04-18 ENCOUNTER — Other Ambulatory Visit: Payer: Self-pay

## 2023-04-18 DIAGNOSIS — Z510 Encounter for antineoplastic radiation therapy: Secondary | ICD-10-CM | POA: Diagnosis not present

## 2023-04-18 LAB — RAD ONC ARIA SESSION SUMMARY
Course Elapsed Days: 1
Plan Fractions Treated to Date: 2
Plan Prescribed Dose Per Fraction: 1.8 Gy
Plan Total Fractions Prescribed: 25
Plan Total Prescribed Dose: 45 Gy
Reference Point Dosage Given to Date: 3.6 Gy
Reference Point Session Dosage Given: 1.8 Gy
Session Number: 2

## 2023-04-19 ENCOUNTER — Ambulatory Visit
Admission: RE | Admit: 2023-04-19 | Discharge: 2023-04-19 | Disposition: A | Discharge status: Home / Self Care | Payer: Medicare Other | Source: Ambulatory Visit | Attending: Radiation Oncology

## 2023-04-19 ENCOUNTER — Other Ambulatory Visit: Payer: Self-pay

## 2023-04-19 DIAGNOSIS — Z510 Encounter for antineoplastic radiation therapy: Secondary | ICD-10-CM | POA: Diagnosis not present

## 2023-04-19 LAB — RAD ONC ARIA SESSION SUMMARY
Course Elapsed Days: 2
Plan Fractions Treated to Date: 3
Plan Prescribed Dose Per Fraction: 1.8 Gy
Plan Total Fractions Prescribed: 25
Plan Total Prescribed Dose: 45 Gy
Reference Point Dosage Given to Date: 5.4 Gy
Reference Point Session Dosage Given: 1.8 Gy
Session Number: 3

## 2023-04-20 ENCOUNTER — Ambulatory Visit
Admission: RE | Admit: 2023-04-20 | Discharge: 2023-04-20 | Disposition: A | Discharge status: Home / Self Care | Payer: Medicare Other | Source: Ambulatory Visit | Attending: Radiation Oncology | Admitting: Radiation Oncology

## 2023-04-20 ENCOUNTER — Other Ambulatory Visit: Payer: Self-pay

## 2023-04-20 ENCOUNTER — Ambulatory Visit
Admission: RE | Admit: 2023-04-20 | Discharge: 2023-04-20 | Disposition: A | Discharge status: Home / Self Care | Payer: Medicare Other | Source: Ambulatory Visit | Attending: Radiation Oncology

## 2023-04-20 DIAGNOSIS — Z510 Encounter for antineoplastic radiation therapy: Secondary | ICD-10-CM | POA: Diagnosis not present

## 2023-04-20 LAB — RAD ONC ARIA SESSION SUMMARY
Course Elapsed Days: 3
Plan Fractions Treated to Date: 4
Plan Prescribed Dose Per Fraction: 1.8 Gy
Plan Total Fractions Prescribed: 25
Plan Total Prescribed Dose: 45 Gy
Reference Point Dosage Given to Date: 7.2 Gy
Reference Point Session Dosage Given: 1.8 Gy
Session Number: 4

## 2023-04-21 ENCOUNTER — Other Ambulatory Visit: Payer: Self-pay

## 2023-04-21 ENCOUNTER — Ambulatory Visit
Admission: RE | Admit: 2023-04-21 | Discharge: 2023-04-21 | Disposition: A | Discharge status: Home / Self Care | Payer: Medicare Other | Source: Ambulatory Visit | Attending: Radiation Oncology | Admitting: Radiation Oncology

## 2023-04-21 DIAGNOSIS — Z510 Encounter for antineoplastic radiation therapy: Secondary | ICD-10-CM | POA: Diagnosis not present

## 2023-04-21 LAB — RAD ONC ARIA SESSION SUMMARY
Course Elapsed Days: 4
Plan Fractions Treated to Date: 5
Plan Prescribed Dose Per Fraction: 1.8 Gy
Plan Total Fractions Prescribed: 25
Plan Total Prescribed Dose: 45 Gy
Reference Point Dosage Given to Date: 9 Gy
Reference Point Session Dosage Given: 1.8 Gy
Session Number: 5

## 2023-04-24 ENCOUNTER — Ambulatory Visit
Admission: RE | Admit: 2023-04-24 | Discharge: 2023-04-24 | Disposition: A | Discharge status: Home / Self Care | Payer: Medicare Other | Source: Ambulatory Visit | Attending: Radiation Oncology | Admitting: Radiation Oncology

## 2023-04-24 DIAGNOSIS — Z510 Encounter for antineoplastic radiation therapy: Secondary | ICD-10-CM | POA: Insufficient documentation

## 2023-04-24 DIAGNOSIS — C61 Malignant neoplasm of prostate: Secondary | ICD-10-CM | POA: Insufficient documentation

## 2023-04-25 ENCOUNTER — Other Ambulatory Visit: Payer: Self-pay

## 2023-04-25 ENCOUNTER — Ambulatory Visit
Admission: RE | Admit: 2023-04-25 | Discharge: 2023-04-25 | Disposition: A | Discharge status: Home / Self Care | Payer: Medicare Other | Source: Ambulatory Visit | Attending: Radiation Oncology

## 2023-04-25 DIAGNOSIS — Z510 Encounter for antineoplastic radiation therapy: Secondary | ICD-10-CM | POA: Diagnosis not present

## 2023-04-25 LAB — RAD ONC ARIA SESSION SUMMARY
Course Elapsed Days: 8
Plan Fractions Treated to Date: 7
Plan Prescribed Dose Per Fraction: 1.8 Gy
Plan Total Fractions Prescribed: 25
Plan Total Prescribed Dose: 45 Gy
Reference Point Dosage Given to Date: 12.6 Gy
Reference Point Session Dosage Given: 2.3699 Gy
Session Number: 6

## 2023-04-26 ENCOUNTER — Ambulatory Visit
Admission: RE | Admit: 2023-04-26 | Discharge: 2023-04-26 | Disposition: A | Discharge status: Home / Self Care | Payer: Medicare Other | Source: Ambulatory Visit | Attending: Radiation Oncology | Admitting: Radiation Oncology

## 2023-04-26 ENCOUNTER — Other Ambulatory Visit: Payer: Self-pay

## 2023-04-26 DIAGNOSIS — Z510 Encounter for antineoplastic radiation therapy: Secondary | ICD-10-CM | POA: Diagnosis not present

## 2023-04-26 LAB — RAD ONC ARIA SESSION SUMMARY
Course Elapsed Days: 9
Plan Fractions Treated to Date: 8
Plan Prescribed Dose Per Fraction: 1.8 Gy
Plan Total Fractions Prescribed: 25
Plan Total Prescribed Dose: 45 Gy
Reference Point Dosage Given to Date: 14.4 Gy
Reference Point Session Dosage Given: 1.8 Gy
Session Number: 7

## 2023-04-27 ENCOUNTER — Other Ambulatory Visit: Payer: Self-pay

## 2023-04-27 ENCOUNTER — Ambulatory Visit
Admission: RE | Admit: 2023-04-27 | Discharge: 2023-04-27 | Disposition: A | Discharge status: Home / Self Care | Payer: Medicare Other | Source: Ambulatory Visit | Attending: Radiation Oncology | Admitting: Radiation Oncology

## 2023-04-27 DIAGNOSIS — Z510 Encounter for antineoplastic radiation therapy: Secondary | ICD-10-CM | POA: Diagnosis not present

## 2023-04-27 LAB — RAD ONC ARIA SESSION SUMMARY
Course Elapsed Days: 10
Plan Fractions Treated to Date: 9
Plan Prescribed Dose Per Fraction: 1.8 Gy
Plan Total Fractions Prescribed: 25
Plan Total Prescribed Dose: 45 Gy
Reference Point Dosage Given to Date: 16.2 Gy
Reference Point Session Dosage Given: 1.8 Gy
Session Number: 8

## 2023-04-28 ENCOUNTER — Ambulatory Visit
Admission: RE | Admit: 2023-04-28 | Discharge: 2023-04-28 | Disposition: A | Discharge status: Home / Self Care | Payer: Medicare Other | Source: Ambulatory Visit | Attending: Radiation Oncology | Admitting: Radiation Oncology

## 2023-04-28 ENCOUNTER — Other Ambulatory Visit: Payer: Self-pay

## 2023-04-28 DIAGNOSIS — Z510 Encounter for antineoplastic radiation therapy: Secondary | ICD-10-CM | POA: Diagnosis not present

## 2023-04-28 LAB — RAD ONC ARIA SESSION SUMMARY
Course Elapsed Days: 11
Plan Fractions Treated to Date: 10
Plan Prescribed Dose Per Fraction: 1.8 Gy
Plan Total Fractions Prescribed: 25
Plan Total Prescribed Dose: 45 Gy
Reference Point Dosage Given to Date: 18 Gy
Reference Point Session Dosage Given: 1.8 Gy
Session Number: 9

## 2023-05-01 ENCOUNTER — Other Ambulatory Visit: Payer: Self-pay

## 2023-05-01 ENCOUNTER — Ambulatory Visit
Admission: RE | Admit: 2023-05-01 | Discharge: 2023-05-01 | Disposition: A | Discharge status: Home / Self Care | Payer: Medicare Other | Source: Ambulatory Visit | Attending: Radiation Oncology | Admitting: Radiation Oncology

## 2023-05-01 DIAGNOSIS — Z510 Encounter for antineoplastic radiation therapy: Secondary | ICD-10-CM | POA: Diagnosis not present

## 2023-05-01 LAB — RAD ONC ARIA SESSION SUMMARY
Course Elapsed Days: 14
Plan Fractions Treated to Date: 11
Plan Prescribed Dose Per Fraction: 1.8 Gy
Plan Total Fractions Prescribed: 25
Plan Total Prescribed Dose: 45 Gy
Reference Point Dosage Given to Date: 19.8 Gy
Reference Point Session Dosage Given: 1.8 Gy
Session Number: 10

## 2023-05-02 ENCOUNTER — Other Ambulatory Visit: Payer: Self-pay

## 2023-05-02 ENCOUNTER — Ambulatory Visit
Admission: RE | Admit: 2023-05-02 | Discharge: 2023-05-02 | Disposition: A | Discharge status: Home / Self Care | Payer: Medicare Other | Source: Ambulatory Visit | Attending: Radiation Oncology

## 2023-05-02 DIAGNOSIS — Z510 Encounter for antineoplastic radiation therapy: Secondary | ICD-10-CM | POA: Diagnosis not present

## 2023-05-02 LAB — RAD ONC ARIA SESSION SUMMARY
Course Elapsed Days: 15
Plan Fractions Treated to Date: 12
Plan Prescribed Dose Per Fraction: 1.8 Gy
Plan Total Fractions Prescribed: 25
Plan Total Prescribed Dose: 45 Gy
Reference Point Dosage Given to Date: 21.6 Gy
Reference Point Session Dosage Given: 1.8 Gy
Session Number: 11

## 2023-05-03 ENCOUNTER — Ambulatory Visit
Admission: RE | Admit: 2023-05-03 | Discharge: 2023-05-03 | Disposition: A | Discharge status: Home / Self Care | Payer: Medicare Other | Source: Ambulatory Visit | Attending: Radiation Oncology

## 2023-05-03 ENCOUNTER — Other Ambulatory Visit: Payer: Self-pay

## 2023-05-03 DIAGNOSIS — Z510 Encounter for antineoplastic radiation therapy: Secondary | ICD-10-CM | POA: Diagnosis not present

## 2023-05-03 LAB — RAD ONC ARIA SESSION SUMMARY
Course Elapsed Days: 16
Plan Fractions Treated to Date: 13
Plan Prescribed Dose Per Fraction: 1.8 Gy
Plan Total Fractions Prescribed: 25
Plan Total Prescribed Dose: 45 Gy
Reference Point Dosage Given to Date: 23.4 Gy
Reference Point Session Dosage Given: 1.8 Gy
Session Number: 12

## 2023-05-04 ENCOUNTER — Other Ambulatory Visit: Payer: Self-pay

## 2023-05-04 ENCOUNTER — Ambulatory Visit
Admission: RE | Admit: 2023-05-04 | Discharge: 2023-05-04 | Disposition: A | Discharge status: Home / Self Care | Payer: Medicare Other | Source: Ambulatory Visit | Attending: Radiation Oncology | Admitting: Radiation Oncology

## 2023-05-04 ENCOUNTER — Ambulatory Visit
Admission: RE | Admit: 2023-05-04 | Discharge: 2023-05-04 | Discharge status: Home / Self Care | Source: Ambulatory Visit | Attending: Radiation Oncology

## 2023-05-04 DIAGNOSIS — Z510 Encounter for antineoplastic radiation therapy: Secondary | ICD-10-CM | POA: Diagnosis not present

## 2023-05-04 LAB — RAD ONC ARIA SESSION SUMMARY
Course Elapsed Days: 17
Plan Fractions Treated to Date: 14
Plan Prescribed Dose Per Fraction: 1.8 Gy
Plan Total Fractions Prescribed: 25
Plan Total Prescribed Dose: 45 Gy
Reference Point Dosage Given to Date: 25.2 Gy
Reference Point Session Dosage Given: 1.8 Gy
Session Number: 13

## 2023-05-05 ENCOUNTER — Other Ambulatory Visit: Payer: Self-pay

## 2023-05-05 ENCOUNTER — Ambulatory Visit
Admission: RE | Admit: 2023-05-05 | Discharge: 2023-05-05 | Disposition: A | Discharge status: Home / Self Care | Payer: Medicare Other | Source: Ambulatory Visit | Attending: Radiation Oncology | Admitting: Radiation Oncology

## 2023-05-05 ENCOUNTER — Ambulatory Visit

## 2023-05-05 DIAGNOSIS — Z510 Encounter for antineoplastic radiation therapy: Secondary | ICD-10-CM | POA: Diagnosis not present

## 2023-05-05 LAB — RAD ONC ARIA SESSION SUMMARY
Course Elapsed Days: 18
Plan Fractions Treated to Date: 15
Plan Prescribed Dose Per Fraction: 1.8 Gy
Plan Total Fractions Prescribed: 25
Plan Total Prescribed Dose: 45 Gy
Reference Point Dosage Given to Date: 27 Gy
Reference Point Session Dosage Given: 1.8 Gy
Session Number: 14

## 2023-05-08 ENCOUNTER — Other Ambulatory Visit: Payer: Self-pay

## 2023-05-08 ENCOUNTER — Ambulatory Visit
Admission: RE | Admit: 2023-05-08 | Discharge: 2023-05-08 | Disposition: A | Discharge status: Home / Self Care | Payer: Medicare Other | Source: Ambulatory Visit | Attending: Radiation Oncology

## 2023-05-08 DIAGNOSIS — Z510 Encounter for antineoplastic radiation therapy: Secondary | ICD-10-CM | POA: Diagnosis not present

## 2023-05-08 LAB — RAD ONC ARIA SESSION SUMMARY
Course Elapsed Days: 21
Plan Fractions Treated to Date: 16
Plan Prescribed Dose Per Fraction: 1.8 Gy
Plan Total Fractions Prescribed: 25
Plan Total Prescribed Dose: 45 Gy
Reference Point Dosage Given to Date: 28.8 Gy
Reference Point Session Dosage Given: 1.8 Gy
Session Number: 15

## 2023-05-09 ENCOUNTER — Other Ambulatory Visit: Payer: Self-pay

## 2023-05-09 ENCOUNTER — Ambulatory Visit
Admission: RE | Admit: 2023-05-09 | Discharge: 2023-05-09 | Disposition: A | Discharge status: Home / Self Care | Payer: Medicare Other | Source: Ambulatory Visit | Attending: Radiation Oncology | Admitting: Radiation Oncology

## 2023-05-09 DIAGNOSIS — Z510 Encounter for antineoplastic radiation therapy: Secondary | ICD-10-CM | POA: Diagnosis not present

## 2023-05-09 LAB — RAD ONC ARIA SESSION SUMMARY
Course Elapsed Days: 22
Plan Fractions Treated to Date: 17
Plan Prescribed Dose Per Fraction: 1.8 Gy
Plan Total Fractions Prescribed: 25
Plan Total Prescribed Dose: 45 Gy
Reference Point Dosage Given to Date: 30.6 Gy
Reference Point Session Dosage Given: 1.8 Gy
Session Number: 16

## 2023-05-10 ENCOUNTER — Ambulatory Visit
Admission: RE | Admit: 2023-05-10 | Discharge: 2023-05-10 | Disposition: A | Discharge status: Home / Self Care | Payer: Medicare Other | Source: Ambulatory Visit | Attending: Radiation Oncology | Admitting: Radiation Oncology

## 2023-05-10 ENCOUNTER — Other Ambulatory Visit: Payer: Self-pay

## 2023-05-10 DIAGNOSIS — Z510 Encounter for antineoplastic radiation therapy: Secondary | ICD-10-CM | POA: Diagnosis not present

## 2023-05-10 LAB — RAD ONC ARIA SESSION SUMMARY
Course Elapsed Days: 23
Plan Fractions Treated to Date: 18
Plan Prescribed Dose Per Fraction: 1.8 Gy
Plan Total Fractions Prescribed: 25
Plan Total Prescribed Dose: 45 Gy
Reference Point Dosage Given to Date: 32.4 Gy
Reference Point Session Dosage Given: 1.8 Gy
Session Number: 17

## 2023-05-11 ENCOUNTER — Other Ambulatory Visit: Payer: Self-pay

## 2023-05-11 ENCOUNTER — Ambulatory Visit
Admission: RE | Admit: 2023-05-11 | Discharge: 2023-05-11 | Discharge status: Home / Self Care | Payer: Medicare Other | Source: Ambulatory Visit | Attending: Radiation Oncology

## 2023-05-11 DIAGNOSIS — Z510 Encounter for antineoplastic radiation therapy: Secondary | ICD-10-CM | POA: Diagnosis not present

## 2023-05-11 LAB — RAD ONC ARIA SESSION SUMMARY
Course Elapsed Days: 24
Plan Fractions Treated to Date: 19
Plan Prescribed Dose Per Fraction: 1.8 Gy
Plan Total Fractions Prescribed: 25
Plan Total Prescribed Dose: 45 Gy
Reference Point Dosage Given to Date: 34.2 Gy
Reference Point Session Dosage Given: 1.8 Gy
Session Number: 18

## 2023-05-12 ENCOUNTER — Ambulatory Visit
Admission: RE | Admit: 2023-05-12 | Discharge: 2023-05-12 | Disposition: A | Discharge status: Home / Self Care | Payer: Medicare Other | Source: Ambulatory Visit | Attending: Radiation Oncology | Admitting: Radiation Oncology

## 2023-05-12 ENCOUNTER — Other Ambulatory Visit: Payer: Self-pay

## 2023-05-12 ENCOUNTER — Ambulatory Visit
Admission: RE | Admit: 2023-05-12 | Discharge: 2023-05-12 | Disposition: A | Discharge status: Home / Self Care | Source: Ambulatory Visit | Attending: Radiation Oncology | Admitting: Radiation Oncology

## 2023-05-12 DIAGNOSIS — Z510 Encounter for antineoplastic radiation therapy: Secondary | ICD-10-CM | POA: Diagnosis not present

## 2023-05-12 LAB — RAD ONC ARIA SESSION SUMMARY
Course Elapsed Days: 25
Plan Fractions Treated to Date: 20
Plan Prescribed Dose Per Fraction: 1.8 Gy
Plan Total Fractions Prescribed: 25
Plan Total Prescribed Dose: 45 Gy
Reference Point Dosage Given to Date: 36 Gy
Reference Point Session Dosage Given: 1.8 Gy
Session Number: 19

## 2023-05-15 ENCOUNTER — Other Ambulatory Visit: Payer: Self-pay

## 2023-05-15 ENCOUNTER — Ambulatory Visit
Admission: RE | Admit: 2023-05-15 | Discharge: 2023-05-15 | Disposition: A | Discharge status: Home / Self Care | Payer: Medicare Other | Source: Ambulatory Visit | Attending: Radiation Oncology | Admitting: Radiation Oncology

## 2023-05-15 DIAGNOSIS — Z510 Encounter for antineoplastic radiation therapy: Secondary | ICD-10-CM | POA: Diagnosis not present

## 2023-05-15 LAB — RAD ONC ARIA SESSION SUMMARY
Course Elapsed Days: 28
Plan Fractions Treated to Date: 21
Plan Prescribed Dose Per Fraction: 1.8 Gy
Plan Total Fractions Prescribed: 25
Plan Total Prescribed Dose: 45 Gy
Reference Point Dosage Given to Date: 37.8 Gy
Reference Point Session Dosage Given: 1.8 Gy
Session Number: 20

## 2023-05-16 ENCOUNTER — Other Ambulatory Visit: Payer: Self-pay

## 2023-05-16 ENCOUNTER — Ambulatory Visit
Admission: RE | Admit: 2023-05-16 | Discharge: 2023-05-16 | Disposition: A | Discharge status: Home / Self Care | Payer: Medicare Other | Source: Ambulatory Visit | Attending: Radiation Oncology

## 2023-05-16 DIAGNOSIS — Z510 Encounter for antineoplastic radiation therapy: Secondary | ICD-10-CM | POA: Diagnosis not present

## 2023-05-16 LAB — RAD ONC ARIA SESSION SUMMARY
Course Elapsed Days: 29
Plan Fractions Treated to Date: 22
Plan Prescribed Dose Per Fraction: 1.8 Gy
Plan Total Fractions Prescribed: 25
Plan Total Prescribed Dose: 45 Gy
Reference Point Dosage Given to Date: 39.6 Gy
Reference Point Session Dosage Given: 1.8 Gy
Session Number: 21

## 2023-05-17 ENCOUNTER — Other Ambulatory Visit: Payer: Self-pay

## 2023-05-17 ENCOUNTER — Ambulatory Visit
Admission: RE | Admit: 2023-05-17 | Discharge: 2023-05-17 | Disposition: A | Discharge status: Home / Self Care | Payer: Medicare Other | Source: Ambulatory Visit | Attending: Radiation Oncology | Admitting: Radiation Oncology

## 2023-05-17 DIAGNOSIS — Z510 Encounter for antineoplastic radiation therapy: Secondary | ICD-10-CM | POA: Diagnosis not present

## 2023-05-17 LAB — RAD ONC ARIA SESSION SUMMARY
Course Elapsed Days: 30
Plan Fractions Treated to Date: 23
Plan Prescribed Dose Per Fraction: 1.8 Gy
Plan Total Fractions Prescribed: 25
Plan Total Prescribed Dose: 45 Gy
Reference Point Dosage Given to Date: 41.4 Gy
Reference Point Session Dosage Given: 1.8 Gy
Session Number: 22

## 2023-05-18 ENCOUNTER — Ambulatory Visit
Admission: RE | Admit: 2023-05-18 | Discharge: 2023-05-18 | Disposition: A | Discharge status: Home / Self Care | Payer: Medicare Other | Source: Ambulatory Visit | Attending: Radiation Oncology | Admitting: Radiation Oncology

## 2023-05-18 ENCOUNTER — Other Ambulatory Visit: Payer: Self-pay

## 2023-05-18 DIAGNOSIS — Z510 Encounter for antineoplastic radiation therapy: Secondary | ICD-10-CM | POA: Diagnosis not present

## 2023-05-18 LAB — RAD ONC ARIA SESSION SUMMARY
Course Elapsed Days: 31
Plan Fractions Treated to Date: 24
Plan Prescribed Dose Per Fraction: 1.8 Gy
Plan Total Fractions Prescribed: 25
Plan Total Prescribed Dose: 45 Gy
Reference Point Dosage Given to Date: 43.2 Gy
Reference Point Session Dosage Given: 1.8 Gy
Session Number: 23

## 2023-05-19 ENCOUNTER — Ambulatory Visit
Admission: RE | Admit: 2023-05-19 | Discharge: 2023-05-19 | Disposition: A | Discharge status: Home / Self Care | Payer: Medicare Other | Source: Ambulatory Visit | Attending: Radiation Oncology | Admitting: Radiation Oncology

## 2023-05-19 ENCOUNTER — Other Ambulatory Visit: Payer: Self-pay

## 2023-05-19 ENCOUNTER — Ambulatory Visit
Admission: RE | Admit: 2023-05-19 | Discharge: 2023-05-19 | Disposition: A | Discharge status: Home / Self Care | Source: Ambulatory Visit | Attending: Radiation Oncology | Admitting: Radiation Oncology

## 2023-05-19 DIAGNOSIS — Z510 Encounter for antineoplastic radiation therapy: Secondary | ICD-10-CM | POA: Diagnosis not present

## 2023-05-19 LAB — RAD ONC ARIA SESSION SUMMARY
Course Elapsed Days: 32
Plan Fractions Treated to Date: 25
Plan Prescribed Dose Per Fraction: 1.8 Gy
Plan Total Fractions Prescribed: 25
Plan Total Prescribed Dose: 45 Gy
Reference Point Dosage Given to Date: 45 Gy
Reference Point Session Dosage Given: 1.8 Gy
Session Number: 24

## 2023-05-22 ENCOUNTER — Other Ambulatory Visit: Payer: Self-pay

## 2023-05-22 ENCOUNTER — Ambulatory Visit
Admission: RE | Admit: 2023-05-22 | Discharge: 2023-05-22 | Disposition: A | Discharge status: Home / Self Care | Payer: Medicare Other | Source: Ambulatory Visit | Attending: Radiation Oncology | Admitting: Radiation Oncology

## 2023-05-22 DIAGNOSIS — Z510 Encounter for antineoplastic radiation therapy: Secondary | ICD-10-CM | POA: Diagnosis not present

## 2023-05-22 LAB — RAD ONC ARIA SESSION SUMMARY
Course Elapsed Days: 35
Plan Fractions Treated to Date: 1
Plan Prescribed Dose Per Fraction: 2 Gy
Plan Total Fractions Prescribed: 15
Plan Total Prescribed Dose: 30 Gy
Reference Point Dosage Given to Date: 2 Gy
Reference Point Session Dosage Given: 2 Gy
Session Number: 25

## 2023-05-23 ENCOUNTER — Other Ambulatory Visit: Payer: Self-pay

## 2023-05-23 ENCOUNTER — Ambulatory Visit
Admission: RE | Admit: 2023-05-23 | Discharge: 2023-05-23 | Disposition: A | Discharge status: Home / Self Care | Payer: Medicare Other | Source: Ambulatory Visit | Attending: Radiation Oncology | Admitting: Radiation Oncology

## 2023-05-23 DIAGNOSIS — C61 Malignant neoplasm of prostate: Secondary | ICD-10-CM | POA: Diagnosis present

## 2023-05-23 DIAGNOSIS — Z510 Encounter for antineoplastic radiation therapy: Secondary | ICD-10-CM | POA: Diagnosis present

## 2023-05-23 LAB — RAD ONC ARIA SESSION SUMMARY
Course Elapsed Days: 36
Plan Fractions Treated to Date: 2
Plan Prescribed Dose Per Fraction: 2 Gy
Plan Total Fractions Prescribed: 15
Plan Total Prescribed Dose: 30 Gy
Reference Point Dosage Given to Date: 4 Gy
Reference Point Session Dosage Given: 2 Gy
Session Number: 26

## 2023-05-24 ENCOUNTER — Other Ambulatory Visit: Payer: Self-pay

## 2023-05-24 ENCOUNTER — Ambulatory Visit
Admission: RE | Admit: 2023-05-24 | Discharge: 2023-05-24 | Disposition: A | Discharge status: Home / Self Care | Payer: Medicare Other | Source: Ambulatory Visit | Attending: Radiation Oncology | Admitting: Radiation Oncology

## 2023-05-24 DIAGNOSIS — Z510 Encounter for antineoplastic radiation therapy: Secondary | ICD-10-CM | POA: Diagnosis not present

## 2023-05-24 LAB — RAD ONC ARIA SESSION SUMMARY
Course Elapsed Days: 37
Plan Fractions Treated to Date: 3
Plan Prescribed Dose Per Fraction: 2 Gy
Plan Total Fractions Prescribed: 15
Plan Total Prescribed Dose: 30 Gy
Reference Point Dosage Given to Date: 6 Gy
Reference Point Session Dosage Given: 2 Gy
Session Number: 27

## 2023-05-25 ENCOUNTER — Other Ambulatory Visit: Payer: Self-pay

## 2023-05-25 ENCOUNTER — Ambulatory Visit
Admission: RE | Admit: 2023-05-25 | Discharge: 2023-05-25 | Disposition: A | Discharge status: Home / Self Care | Payer: Medicare Other | Source: Ambulatory Visit | Attending: Radiation Oncology

## 2023-05-25 DIAGNOSIS — Z510 Encounter for antineoplastic radiation therapy: Secondary | ICD-10-CM | POA: Diagnosis not present

## 2023-05-25 LAB — RAD ONC ARIA SESSION SUMMARY
Course Elapsed Days: 38
Plan Fractions Treated to Date: 4
Plan Prescribed Dose Per Fraction: 2 Gy
Plan Total Fractions Prescribed: 15
Plan Total Prescribed Dose: 30 Gy
Reference Point Dosage Given to Date: 8 Gy
Reference Point Session Dosage Given: 2 Gy
Session Number: 28

## 2023-05-26 ENCOUNTER — Ambulatory Visit
Admission: RE | Admit: 2023-05-26 | Discharge: 2023-05-26 | Disposition: A | Discharge status: Home / Self Care | Source: Ambulatory Visit | Attending: Radiation Oncology | Admitting: Radiation Oncology

## 2023-05-26 ENCOUNTER — Ambulatory Visit
Admission: RE | Admit: 2023-05-26 | Discharge: 2023-05-26 | Disposition: A | Discharge status: Home / Self Care | Payer: Medicare Other | Source: Ambulatory Visit | Attending: Radiation Oncology | Admitting: Radiation Oncology

## 2023-05-26 ENCOUNTER — Other Ambulatory Visit: Payer: Self-pay

## 2023-05-26 DIAGNOSIS — Z510 Encounter for antineoplastic radiation therapy: Secondary | ICD-10-CM | POA: Diagnosis not present

## 2023-05-26 LAB — RAD ONC ARIA SESSION SUMMARY
Course Elapsed Days: 39
Plan Fractions Treated to Date: 5
Plan Prescribed Dose Per Fraction: 2 Gy
Plan Total Fractions Prescribed: 15
Plan Total Prescribed Dose: 30 Gy
Reference Point Dosage Given to Date: 10 Gy
Reference Point Session Dosage Given: 2 Gy
Session Number: 29

## 2023-05-29 ENCOUNTER — Ambulatory Visit
Admission: RE | Admit: 2023-05-29 | Discharge: 2023-05-29 | Disposition: A | Discharge status: Home / Self Care | Payer: Medicare Other | Source: Ambulatory Visit | Attending: Radiation Oncology

## 2023-05-29 ENCOUNTER — Other Ambulatory Visit: Payer: Self-pay

## 2023-05-29 DIAGNOSIS — Z510 Encounter for antineoplastic radiation therapy: Secondary | ICD-10-CM | POA: Diagnosis not present

## 2023-05-29 LAB — RAD ONC ARIA SESSION SUMMARY
Course Elapsed Days: 42
Plan Fractions Treated to Date: 6
Plan Prescribed Dose Per Fraction: 2 Gy
Plan Total Fractions Prescribed: 15
Plan Total Prescribed Dose: 30 Gy
Reference Point Dosage Given to Date: 12 Gy
Reference Point Session Dosage Given: 2 Gy
Session Number: 30

## 2023-05-30 ENCOUNTER — Other Ambulatory Visit: Payer: Self-pay

## 2023-05-30 ENCOUNTER — Ambulatory Visit
Admission: RE | Admit: 2023-05-30 | Discharge: 2023-05-30 | Disposition: A | Discharge status: Home / Self Care | Payer: Medicare Other | Source: Ambulatory Visit | Attending: Radiation Oncology

## 2023-05-30 DIAGNOSIS — Z510 Encounter for antineoplastic radiation therapy: Secondary | ICD-10-CM | POA: Diagnosis not present

## 2023-05-30 LAB — RAD ONC ARIA SESSION SUMMARY
Course Elapsed Days: 43
Plan Fractions Treated to Date: 7
Plan Prescribed Dose Per Fraction: 2 Gy
Plan Total Fractions Prescribed: 15
Plan Total Prescribed Dose: 30 Gy
Reference Point Dosage Given to Date: 14 Gy
Reference Point Session Dosage Given: 2 Gy
Session Number: 31

## 2023-05-31 ENCOUNTER — Other Ambulatory Visit: Payer: Self-pay

## 2023-05-31 ENCOUNTER — Ambulatory Visit
Admission: RE | Admit: 2023-05-31 | Discharge: 2023-05-31 | Disposition: A | Discharge status: Home / Self Care | Payer: Medicare Other | Source: Ambulatory Visit | Attending: Radiation Oncology

## 2023-05-31 DIAGNOSIS — Z510 Encounter for antineoplastic radiation therapy: Secondary | ICD-10-CM | POA: Diagnosis not present

## 2023-05-31 LAB — RAD ONC ARIA SESSION SUMMARY
Course Elapsed Days: 44
Plan Fractions Treated to Date: 8
Plan Prescribed Dose Per Fraction: 2 Gy
Plan Total Fractions Prescribed: 15
Plan Total Prescribed Dose: 30 Gy
Reference Point Dosage Given to Date: 16 Gy
Reference Point Session Dosage Given: 2 Gy
Session Number: 32

## 2023-06-01 ENCOUNTER — Other Ambulatory Visit: Payer: Self-pay

## 2023-06-01 ENCOUNTER — Ambulatory Visit: Payer: Medicare Other

## 2023-06-01 DIAGNOSIS — Z510 Encounter for antineoplastic radiation therapy: Secondary | ICD-10-CM | POA: Diagnosis not present

## 2023-06-01 LAB — RAD ONC ARIA SESSION SUMMARY
Course Elapsed Days: 45
Plan Fractions Treated to Date: 9
Plan Prescribed Dose Per Fraction: 2 Gy
Plan Total Fractions Prescribed: 15
Plan Total Prescribed Dose: 30 Gy
Reference Point Dosage Given to Date: 18 Gy
Reference Point Session Dosage Given: 2 Gy
Session Number: 33

## 2023-06-02 ENCOUNTER — Other Ambulatory Visit: Payer: Self-pay

## 2023-06-02 ENCOUNTER — Ambulatory Visit
Admission: RE | Admit: 2023-06-02 | Discharge: 2023-06-02 | Disposition: A | Discharge status: Home / Self Care | Payer: Medicare Other | Source: Ambulatory Visit | Attending: Radiation Oncology | Admitting: Radiation Oncology

## 2023-06-02 ENCOUNTER — Ambulatory Visit
Admission: RE | Admit: 2023-06-02 | Discharge: 2023-06-02 | Disposition: A | Discharge status: Home / Self Care | Source: Ambulatory Visit | Attending: Radiation Oncology | Admitting: Radiation Oncology

## 2023-06-02 DIAGNOSIS — Z510 Encounter for antineoplastic radiation therapy: Secondary | ICD-10-CM | POA: Diagnosis not present

## 2023-06-02 LAB — RAD ONC ARIA SESSION SUMMARY
Course Elapsed Days: 46
Plan Fractions Treated to Date: 10
Plan Prescribed Dose Per Fraction: 2 Gy
Plan Total Fractions Prescribed: 15
Plan Total Prescribed Dose: 30 Gy
Reference Point Dosage Given to Date: 20 Gy
Reference Point Session Dosage Given: 2 Gy
Session Number: 34

## 2023-06-05 ENCOUNTER — Other Ambulatory Visit: Payer: Self-pay

## 2023-06-05 ENCOUNTER — Ambulatory Visit
Admission: RE | Admit: 2023-06-05 | Discharge: 2023-06-05 | Disposition: A | Discharge status: Home / Self Care | Payer: Medicare Other | Source: Ambulatory Visit | Attending: Radiation Oncology | Admitting: Radiation Oncology

## 2023-06-05 DIAGNOSIS — Z510 Encounter for antineoplastic radiation therapy: Secondary | ICD-10-CM | POA: Diagnosis not present

## 2023-06-05 LAB — RAD ONC ARIA SESSION SUMMARY
Course Elapsed Days: 49
Plan Fractions Treated to Date: 11
Plan Prescribed Dose Per Fraction: 2 Gy
Plan Total Fractions Prescribed: 15
Plan Total Prescribed Dose: 30 Gy
Reference Point Dosage Given to Date: 22 Gy
Reference Point Session Dosage Given: 2 Gy
Session Number: 35

## 2023-06-06 ENCOUNTER — Other Ambulatory Visit: Payer: Self-pay

## 2023-06-06 ENCOUNTER — Ambulatory Visit
Admission: RE | Admit: 2023-06-06 | Discharge: 2023-06-06 | Disposition: A | Discharge status: Home / Self Care | Payer: Medicare Other | Source: Ambulatory Visit | Attending: Radiation Oncology | Admitting: Radiation Oncology

## 2023-06-06 DIAGNOSIS — Z510 Encounter for antineoplastic radiation therapy: Secondary | ICD-10-CM | POA: Diagnosis not present

## 2023-06-06 LAB — RAD ONC ARIA SESSION SUMMARY
Course Elapsed Days: 50
Plan Fractions Treated to Date: 12
Plan Prescribed Dose Per Fraction: 2 Gy
Plan Total Fractions Prescribed: 15
Plan Total Prescribed Dose: 30 Gy
Reference Point Dosage Given to Date: 24 Gy
Reference Point Session Dosage Given: 2 Gy
Session Number: 36

## 2023-06-07 ENCOUNTER — Other Ambulatory Visit: Payer: Self-pay

## 2023-06-07 ENCOUNTER — Ambulatory Visit
Admission: RE | Admit: 2023-06-07 | Discharge: 2023-06-07 | Disposition: A | Discharge status: Home / Self Care | Payer: Medicare Other | Source: Ambulatory Visit | Attending: Radiation Oncology | Admitting: Radiation Oncology

## 2023-06-07 DIAGNOSIS — Z510 Encounter for antineoplastic radiation therapy: Secondary | ICD-10-CM | POA: Diagnosis not present

## 2023-06-07 LAB — RAD ONC ARIA SESSION SUMMARY
Course Elapsed Days: 51
Plan Fractions Treated to Date: 13
Plan Prescribed Dose Per Fraction: 2 Gy
Plan Total Fractions Prescribed: 15
Plan Total Prescribed Dose: 30 Gy
Reference Point Dosage Given to Date: 26 Gy
Reference Point Session Dosage Given: 2 Gy
Session Number: 37

## 2023-06-08 ENCOUNTER — Ambulatory Visit
Admission: RE | Admit: 2023-06-08 | Discharge: 2023-06-08 | Disposition: A | Discharge status: Home / Self Care | Payer: Medicare Other | Source: Ambulatory Visit | Attending: Radiation Oncology | Admitting: Radiation Oncology

## 2023-06-08 ENCOUNTER — Other Ambulatory Visit: Payer: Self-pay

## 2023-06-08 DIAGNOSIS — Z510 Encounter for antineoplastic radiation therapy: Secondary | ICD-10-CM | POA: Diagnosis not present

## 2023-06-08 LAB — RAD ONC ARIA SESSION SUMMARY
Course Elapsed Days: 52
Plan Fractions Treated to Date: 14
Plan Prescribed Dose Per Fraction: 2 Gy
Plan Total Fractions Prescribed: 15
Plan Total Prescribed Dose: 30 Gy
Reference Point Dosage Given to Date: 28 Gy
Reference Point Session Dosage Given: 2 Gy
Session Number: 38

## 2023-06-09 ENCOUNTER — Ambulatory Visit
Admission: RE | Admit: 2023-06-09 | Discharge: 2023-06-09 | Disposition: A | Discharge status: Home / Self Care | Payer: Medicare Other | Source: Ambulatory Visit | Attending: Radiation Oncology | Admitting: Radiation Oncology

## 2023-06-09 ENCOUNTER — Ambulatory Visit
Admission: RE | Admit: 2023-06-09 | Discharge: 2023-06-09 | Disposition: A | Discharge status: Home / Self Care | Source: Ambulatory Visit | Attending: Radiation Oncology | Admitting: Radiation Oncology

## 2023-06-09 ENCOUNTER — Other Ambulatory Visit: Payer: Self-pay

## 2023-06-09 DIAGNOSIS — Z510 Encounter for antineoplastic radiation therapy: Secondary | ICD-10-CM | POA: Diagnosis not present

## 2023-06-09 DIAGNOSIS — C61 Malignant neoplasm of prostate: Secondary | ICD-10-CM

## 2023-06-09 LAB — RAD ONC ARIA SESSION SUMMARY
Course Elapsed Days: 53
Plan Fractions Treated to Date: 15
Plan Prescribed Dose Per Fraction: 2 Gy
Plan Total Fractions Prescribed: 15
Plan Total Prescribed Dose: 30 Gy
Reference Point Dosage Given to Date: 30 Gy
Reference Point Session Dosage Given: 2 Gy
Session Number: 39

## 2023-06-12 NOTE — Radiation Completion Notes (Addendum)
  Radiation Oncology         [phone]) [phone] ________________________________  Name: Harry Garcia MRN: [id number]  Date: 06/09/2023  DOB: 1940/03/02  Referring Physician: Christina Coyer, M.D. Date of Service: 2023-06-12 Radiation Oncologist: Bartholome Ligas, M.D. [facility]     RADIATION ONCOLOGY END OF TREATMENT NOTE   Diagnosis:  83 y.o. gentleman with Stage T2b adenocarcinoma of the prostate with Gleason score of 4+4, and PSA of 29.2.   Intent: Curative     ==========DELIVERED PLANS==========  First Treatment Date: 2023-04-17 Last Treatment Date: 2023-06-09   Plan Name: Prostate_Pel Site: Prostate Technique: IMRT Mode: Photon Dose Per Fraction: 1.8 Gy Prescribed Dose (Delivered / Prescribed): 45 Gy / 45 Gy Prescribed Fxs (Delivered / Prescribed): 25 / 25   Plan Name: Prostate_Bst Site: Prostate Technique: IMRT Mode: Photon Dose Per Fraction: 2 Gy Prescribed Dose (Delivered / Prescribed): 30 Gy / 30 Gy Prescribed Fxs (Delivered / Prescribed): 15 / 15     ==========ON TREATMENT VISIT DATES========== 2023-04-20, 2023-04-28, 2023-05-04, 2023-05-12, 2023-05-19, 2023-05-26, 2023-06-02, 2023-06-09   See weekly On Treatment Notes in Epic for details in the Media tab (listed as Progress notes on the On Treatment Visit Dates listed above).  He tolerated the treatments well with only mild increased LUTS and modest fatigue.  The patient will receive a call in about one month from the radiation oncology department. He will continue follow up with his urologist, Dr. Derrick Fling, as well.  ------------------------------------------------   Kenith Payer, MD [facility]  Radiation Oncology Direct Dial: [phone]  Fax: [phone] [email or web address]  Skype  LinkedIn

## 2023-06-15 NOTE — Progress Notes (Signed)
 Patient was a RadOnc Consult on 01/31/23 for his stage T2b adenocarcinoma of the prostate with Gleason score of 4+4, and PSA of 29.2.  Patient proceed with treatment recommendations of 8 week course of daily external beam therapy concurrent with ADT.   and had his final radiation treatment on 06/09/23.   Patient is scheduled for a post treatment nurse call on 07/11/23 and has follow up with urology on 4/30.

## 2023-07-07 NOTE — Progress Notes (Addendum)
  Radiation Oncology         [phone]) [phone] ________________________________  Name: Harry Garcia MRN: [id number]  Date of Service: 07/07/2023  DOB: 27-Dec-1940  Post Treatment Telephone Note  Diagnosis:  C61 Malignant neoplasm of prostate (as documented in provider EOT note)  Pre Treatment IPSS Score: 1 (as documented in the provider consult note)  The patient was available for call today.   Symptoms of fatigue have improved since completing therapy.  Symptoms of bladder changes have improved since completing therapy. Current symptoms include Tamsulosin, and medications for bladder symptoms include urinary urgency.  Symptoms of bowel changes have improved since completing therapy. Current symptoms include none, and medications for bowel symptoms include none.   Post Treatment IPSS Score: IPSS Questionnaire (AUA-7): Over the past month.   1)  How often have you had a sensation of not emptying your bladder completely after you finish urinating?  0 - Not at all  2)  How often have you had to urinate again less than two hours after you finished urinating? 0 - Not at all  3)  How often have you found you stopped and started again several times when you urinated?  0 - Not at all  4) How difficult have you found it to postpone urination?  3 - About half the time  5) How often have you had a weak urinary stream?  0 - Not at all  6) How often have you had to push or strain to begin urination?  0 - Not at all  7) How many times did you most typically get up to urinate from the time you went to bed until the time you got up in the morning?  2 - 2 times  Total score:  5. Which indicates mild symptoms  0-7 mildly symptomatic   8-19 moderately symptomatic   20-35 severely symptomatic   Patient has a scheduled follow up visit with his urologist, Dr. Derrick Fling, on 10/2023 for ongoing surveillance. He was counseled that PSA levels will be drawn in the urology office, and was reassured that additional  time is expected to improve bowel and bladder symptoms. He was encouraged to call back with concerns or questions regarding radiation.   This concludes the interaction.  Avery Bodo, LPN

## 2023-07-11 ENCOUNTER — Ambulatory Visit
Admission: RE | Admit: 2023-07-11 | Discharge: 2023-07-11 | Disposition: A | Discharge status: Home / Self Care | Source: Ambulatory Visit | Attending: Internal Medicine

## 2023-07-19 ENCOUNTER — Ambulatory Visit
Admission: RE | Admit: 2023-07-19 | Discharge: 2023-07-19 | Disposition: A | Discharge status: Home / Self Care | Payer: Medicare Other | Source: Ambulatory Visit | Attending: Internal Medicine | Admitting: Internal Medicine

## 2023-07-19 DIAGNOSIS — E210 Primary hyperparathyroidism: Secondary | ICD-10-CM

## 2023-07-20 ENCOUNTER — Other Ambulatory Visit: Payer: Self-pay | Admitting: Urology

## 2023-07-20 DIAGNOSIS — C61 Malignant neoplasm of prostate: Secondary | ICD-10-CM

## 2023-07-28 ENCOUNTER — Encounter: Payer: Self-pay | Admitting: *Deleted

## 2023-08-21 ENCOUNTER — Encounter: Payer: Self-pay | Admitting: *Deleted

## 2023-08-21 ENCOUNTER — Inpatient Hospital Stay: Attending: Adult Health | Admitting: *Deleted

## 2023-08-21 DIAGNOSIS — C61 Malignant neoplasm of prostate: Secondary | ICD-10-CM

## 2023-08-21 NOTE — Progress Notes (Signed)
 SCP reviewed and completed. Most recent PSA was 0.15 on 06/21/2023. Next due in September at [facility]. Sent a copy of prostate summary to PCP. Pt is UTD with vaccines. Pt has aged out of colonoscopies.

## 2023-12-07 IMAGING — MR MR PROSTATE WO/W CM
12 series · 48 of 48 positions shown · IV contrast (multihance)
Comparison: None.

CLINICAL DATA: Elevated PSA.  PSA equal 23.  80-year-old male.

EXAM:
MR PROSTATE WITHOUT AND WITH CONTRAST
TECHNIQUE: Multiplanar multisequence MRI images were obtained of the pelvis
centered about the prostate. Pre and post contrast images were
obtained.
CONTRAST:  15mL MULTIHANCE GADOBENATE DIMEGLUMINE 529 MG/ML IV SOLN

[Series 3: T2 · coronal · 3.0mm · 0.56mm/px · 1 of 30 slices shown (1 of 3)]
[im 1/30]
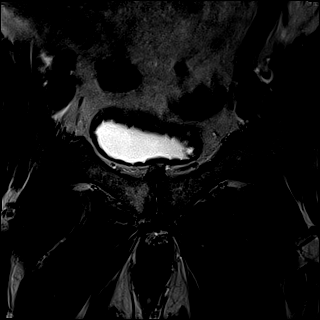

[Series 4: T1 · axial · 5.0mm · 1.25mm/px · 1 of 80 slices shown]
[im 1/80]
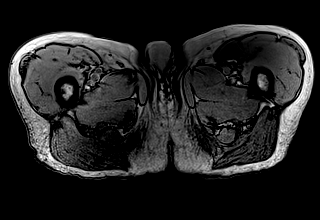

[Series 5: DWI · axial · 3.0mm · 1.75mm/px · 1 of 96 slices shown (1 of 3)]
[im 1/96]
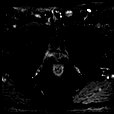

[Series 6: DWI · axial · 3.0mm · 1.75mm/px · 1 of 32 slices shown (2 of 3)]
[im 1/32]
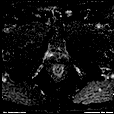

[Series 7: DWI · axial · 3.0mm · 1.75mm/px · 1 of 32 slices shown (3 of 3)]
[im 1/32]
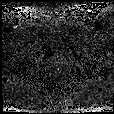

[Series 8: T2 · axial · 3.0mm · 0.56mm/px · 1 of 34 slices shown (2 of 3)]
[im 1/34]
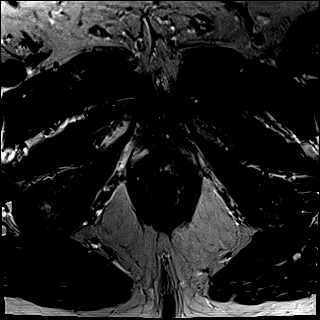

[Series 9: T2 · axial · 1.0mm · 1.04mm/px · z∈[-32,+55]mm · 2 of 88 slices shown (3 of 3)]
[im 1/88]
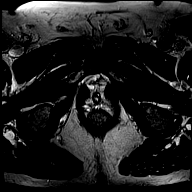
[im 88/88]
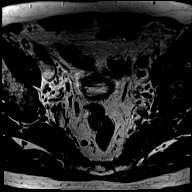

[Series 10: pre t1_twist_tra_dyn · axial · non-contrast · 3.5mm · 0.83mm/px · 1 of 26 slices shown]
[im 1/26]
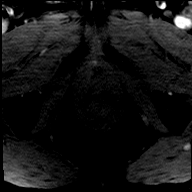

[Series 11: post t1_twist_tra_dyn-copy center · axial · non-contrast · 3.5mm · 0.83mm/px · z∈[-33,+55]mm · 18 of 780 slices shown]
[im 1/780]
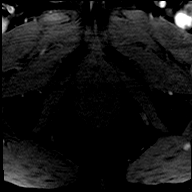
[im 46/780]
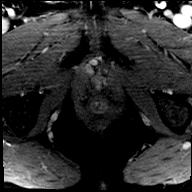
[im 92/780]
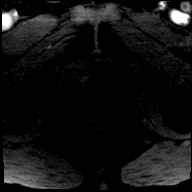
[im 138/780]
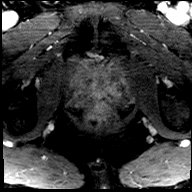
[im 184/780]
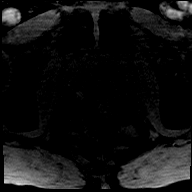
[im 230/780]
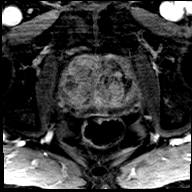
[im 275/780]
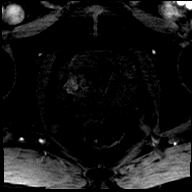
[im 321/780]
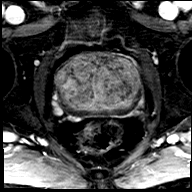
[im 367/780]
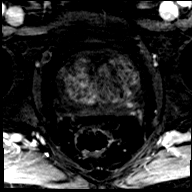
[im 413/780]
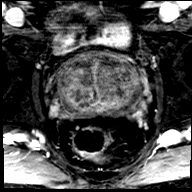
[im 459/780]
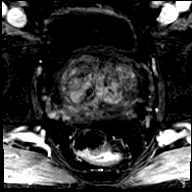
[im 505/780]
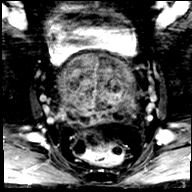
[im 550/780]
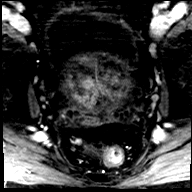
[im 596/780]
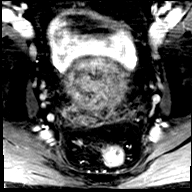
[im 642/780]
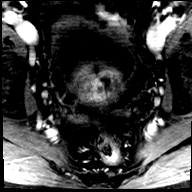
[im 688/780]
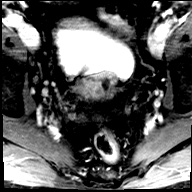
[im 734/780]
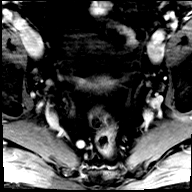
[im 780/780]
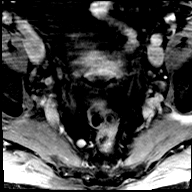

[Series 12: post t1_twist_tra_dyn-copy cent_sub · axial · 3.5mm · 0.83mm/px · z∈[-33,+55]mm · 17 of 754 slices shown]
[im 1/754]
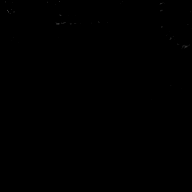
[im 48/754]
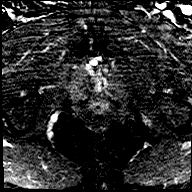
[im 95/754]
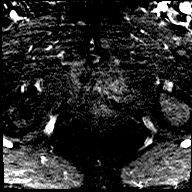
[im 142/754]
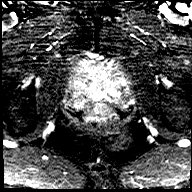
[im 189/754]
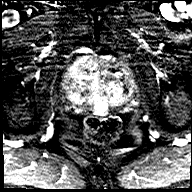
[im 236/754]
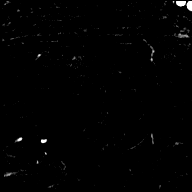
[im 283/754]
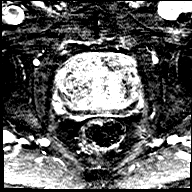
[im 330/754]
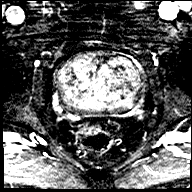
[im 377/754]
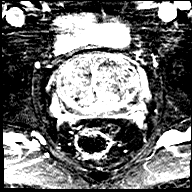
[im 424/754]
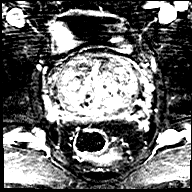
[im 471/754]
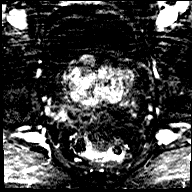
[im 518/754]
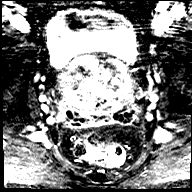
[im 565/754]
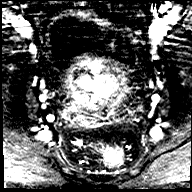
[im 612/754]
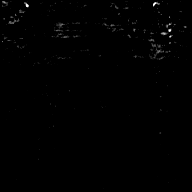
[im 659/754]
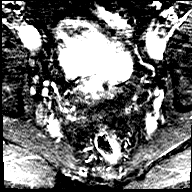
[im 706/754]
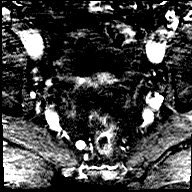
[im 754/754]
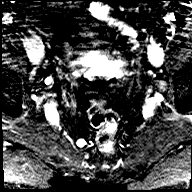

[Series 13: t1_vibe_dixon_tra_f · axial · 2.5mm · 0.91mm/px · z∈[-82,+116]mm · 2 of 80 slices shown]
[im 1/80]
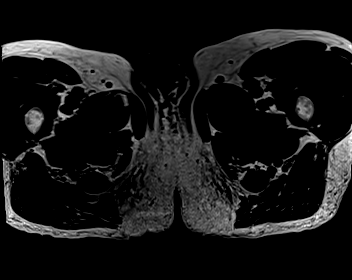
[im 80/80]
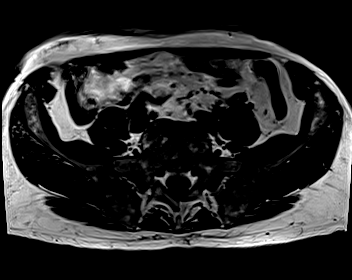

[Series 14: t1_vibe_dixon_tra_w · axial · 2.5mm · 0.91mm/px · z∈[-82,+116]mm · 2 of 80 slices shown]
[im 1/80]
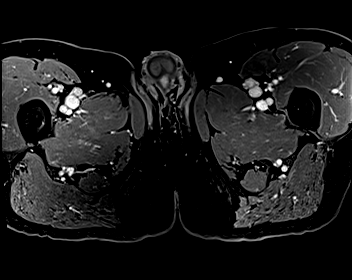
[im 80/80]
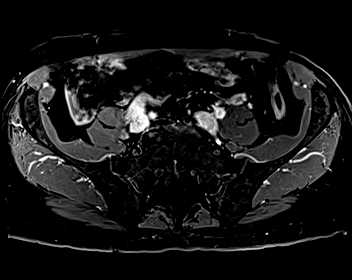

[48 of 48 positions shown; findings below may reference images not displayed]

FINDINGS: Prostate:

Peripheral zone: Small 5 mm focus restricted diffusion in the LEFT
mid gland peripheral zone (image 17/series 6). There is accompanying
focus of low signal intensity on T2 weighted imaging (image
19/series 8)

Second focus of restricted diffusion in the midline peripheral zone
measuring 12 mm x 12 mm (image [DATE]). This corresponds to a
well-circumscribed round lesion in the midline peripheral zone on
image 26/series 8.

Transitional zone:Multiple capsulated nodules without suspicious
imaging characteristics.

Volume: 7.1 x 5.5 x by 7.9 cm (volume = 160 cm^3)

Transcapsular spread:  Absent

Seminal vesicle involvement: Absent

Neurovascular bundle involvement: Absent

Pelvic adenopathy: 7 mm LEFT external iliac nodule node (image
28/series 14) adjacent to the operator space. Similar RIGHT external
iliac lymph node measuring 5 mm on image 32/14

Bone metastasis: None

Other findings:
IMPRESSION: 1. Small lesion in the LEFT mid gland peripheral zone concerning for
high-grade prostate carcinoma. PI-RADS: 4. (Dynacad 3D post
processing performed). WINNIE #1.
2. Rounded nodule in the midline posterior peripheral zone
concerning for high-grade prostate carcinoma. PI-RADS: 4. WINNIE #2.
3. Enlarged nodular transitional zone most consistent with benign
prostate hypertrophy. PI-RADS: 2
4. Bilateral small external iliac lymph nodes.

## 2024-02-09 ENCOUNTER — Other Ambulatory Visit (HOSPITAL_BASED_OUTPATIENT_CLINIC_OR_DEPARTMENT_OTHER): Payer: Self-pay | Admitting: Internal Medicine

## 2024-02-09 DIAGNOSIS — R748 Abnormal levels of other serum enzymes: Secondary | ICD-10-CM

## 2024-02-28 ENCOUNTER — Ambulatory Visit (HOSPITAL_BASED_OUTPATIENT_CLINIC_OR_DEPARTMENT_OTHER)
Admission: RE | Admit: 2024-02-28 | Discharge: 2024-02-28 | Disposition: A | Discharge status: Home / Self Care | Source: Ambulatory Visit | Attending: Internal Medicine | Admitting: Internal Medicine

## 2024-02-28 DIAGNOSIS — R748 Abnormal levels of other serum enzymes: Secondary | ICD-10-CM | POA: Insufficient documentation
